# Patient Record
Sex: Male | Born: 1940 | Race: Black or African American | Hispanic: No | Marital: Married | Smoking: Never smoker
Health system: Southern US, Community
[De-identification: ages and names within clinical notes are randomized; demographics above are authoritative.]

## PROBLEM LIST (undated history)

## (undated) DIAGNOSIS — C61 Malignant neoplasm of prostate: Secondary | ICD-10-CM

## (undated) DIAGNOSIS — I10 Essential (primary) hypertension: Secondary | ICD-10-CM

## (undated) DIAGNOSIS — E785 Hyperlipidemia, unspecified: Secondary | ICD-10-CM

## (undated) HISTORY — PX: APPENDECTOMY: SHX54

## (undated) HISTORY — DX: Essential (primary) hypertension: I10

## (undated) HISTORY — DX: Hyperlipidemia, unspecified: E78.5

---

## 1997-10-24 ENCOUNTER — Encounter: Admission: RE | Admit: 1997-10-24 | Discharge: 1997-10-24 | Payer: Self-pay | Admitting: Hematology and Oncology

## 1997-11-12 ENCOUNTER — Ambulatory Visit (HOSPITAL_COMMUNITY): Admission: RE | Admit: 1997-11-12 | Discharge: 1997-11-12 | Payer: Self-pay | Admitting: *Deleted

## 1997-11-23 ENCOUNTER — Encounter: Admission: RE | Admit: 1997-11-23 | Discharge: 1997-11-23 | Payer: Self-pay | Admitting: Internal Medicine

## 1997-12-25 ENCOUNTER — Encounter: Admission: RE | Admit: 1997-12-25 | Discharge: 1997-12-25 | Payer: Self-pay | Admitting: *Deleted

## 2011-12-30 DIAGNOSIS — I10 Essential (primary) hypertension: Secondary | ICD-10-CM | POA: Insufficient documentation

## 2011-12-30 DIAGNOSIS — R32 Unspecified urinary incontinence: Secondary | ICD-10-CM | POA: Insufficient documentation

## 2011-12-30 DIAGNOSIS — Z7689 Persons encountering health services in other specified circumstances: Secondary | ICD-10-CM | POA: Insufficient documentation

## 2012-01-18 DIAGNOSIS — M7989 Other specified soft tissue disorders: Secondary | ICD-10-CM | POA: Insufficient documentation

## 2012-01-18 DIAGNOSIS — S4990XA Unspecified injury of shoulder and upper arm, unspecified arm, initial encounter: Secondary | ICD-10-CM | POA: Insufficient documentation

## 2012-04-12 ENCOUNTER — Emergency Department: Payer: Self-pay | Admitting: Emergency Medicine

## 2012-04-12 LAB — URINALYSIS, COMPLETE
Bacteria: NONE SEEN
Glucose,UR: NEGATIVE mg/dL (ref 0–75)
Ph: 7 (ref 4.5–8.0)
RBC,UR: 20 /HPF (ref 0–5)
Squamous Epithelial: NONE SEEN
WBC UR: 85 /HPF (ref 0–5)

## 2012-04-12 LAB — CBC
HCT: 34.5 % — ABNORMAL LOW (ref 40.0–52.0)
HGB: 12 g/dL — ABNORMAL LOW (ref 13.0–18.0)
MCH: 32.5 pg (ref 26.0–34.0)
MCHC: 34.6 g/dL (ref 32.0–36.0)
MCV: 94 fL (ref 80–100)
Platelet: 232 10*3/uL (ref 150–440)
RBC: 3.68 10*6/uL — ABNORMAL LOW (ref 4.40–5.90)
RDW: 14.1 % (ref 11.5–14.5)
WBC: 7.3 10*3/uL (ref 3.8–10.6)

## 2012-04-12 LAB — BASIC METABOLIC PANEL
BUN: 7 mg/dL (ref 7–18)
Calcium, Total: 8.6 mg/dL (ref 8.5–10.1)
Chloride: 98 mmol/L (ref 98–107)
Co2: 28 mmol/L (ref 21–32)
Creatinine: 1.1 mg/dL (ref 0.60–1.30)
EGFR (African American): >60
Osmolality: 260 (ref 275–301)
Potassium: 4 mmol/L (ref 3.5–5.1)
Sodium: 130 mmol/L — ABNORMAL LOW (ref 136–145)

## 2012-04-13 DIAGNOSIS — T148XXA Other injury of unspecified body region, initial encounter: Secondary | ICD-10-CM | POA: Insufficient documentation

## 2012-04-13 DIAGNOSIS — E785 Hyperlipidemia, unspecified: Secondary | ICD-10-CM | POA: Insufficient documentation

## 2012-04-13 DIAGNOSIS — I712 Thoracic aortic aneurysm, without rupture: Secondary | ICD-10-CM | POA: Insufficient documentation

## 2012-04-13 DIAGNOSIS — R339 Retention of urine, unspecified: Secondary | ICD-10-CM | POA: Insufficient documentation

## 2012-04-13 DIAGNOSIS — I714 Abdominal aortic aneurysm, without rupture: Secondary | ICD-10-CM | POA: Insufficient documentation

## 2012-04-14 LAB — URINE CULTURE

## 2012-08-04 DIAGNOSIS — Z1210 Encounter for screening for malignant neoplasm of intestinal tract, unspecified: Secondary | ICD-10-CM | POA: Insufficient documentation

## 2012-11-15 DIAGNOSIS — Z5320 Procedure and treatment not carried out because of patient's decision for unspecified reasons: Secondary | ICD-10-CM | POA: Insufficient documentation

## 2012-12-19 ENCOUNTER — Emergency Department: Payer: Self-pay | Admitting: Emergency Medicine

## 2012-12-19 LAB — BASIC METABOLIC PANEL
Anion Gap: 7 (ref 7–16)
BUN: 9 mg/dL (ref 7–18)
Calcium, Total: 8.9 mg/dL (ref 8.5–10.1)
Chloride: 101 mmol/L (ref 98–107)
Co2: 26 mmol/L (ref 21–32)
Creatinine: 0.98 mg/dL (ref 0.60–1.30)
EGFR (African American): >60
EGFR (Non-African Amer.): >60
Glucose: 95 mg/dL (ref 65–99)
Osmolality: 267 (ref 275–301)
Potassium: 3.7 mmol/L (ref 3.5–5.1)
Sodium: 134 mmol/L — ABNORMAL LOW (ref 136–145)

## 2012-12-19 LAB — CBC
HCT: 42.2 % (ref 40.0–52.0)
HGB: 14.2 g/dL (ref 13.0–18.0)
MCH: 31.7 pg (ref 26.0–34.0)
MCHC: 33.7 g/dL (ref 32.0–36.0)
MCV: 94 fL (ref 80–100)
Platelet: 251 10*3/uL (ref 150–440)
RBC: 4.48 10*6/uL (ref 4.40–5.90)
RDW: 14.4 % (ref 11.5–14.5)
WBC: 4.2 10*3/uL (ref 3.8–10.6)

## 2012-12-19 LAB — PRO B NATRIURETIC PEPTIDE: B-Type Natriuretic Peptide: 144 pg/mL — ABNORMAL HIGH (ref 0–125)

## 2012-12-19 LAB — PHOSPHORUS: Phosphorus: 3.7 mg/dL (ref 2.5–4.9)

## 2012-12-19 LAB — TROPONIN I: Troponin-I: <0.02 ng/mL

## 2012-12-19 LAB — MAGNESIUM: Magnesium: 2.2 mg/dL

## 2013-02-22 ENCOUNTER — Ambulatory Visit: Payer: Self-pay | Admitting: Specialist

## 2013-03-20 ENCOUNTER — Ambulatory Visit: Payer: Self-pay | Admitting: Specialist

## 2013-03-20 LAB — BASIC METABOLIC PANEL
Anion Gap: 5 — ABNORMAL LOW (ref 7–16)
BUN: 12 mg/dL (ref 7–18)
CHLORIDE: 100 mmol/L (ref 98–107)
CO2: 28 mmol/L (ref 21–32)
CREATININE: 1.06 mg/dL (ref 0.60–1.30)
Calcium, Total: 8.4 mg/dL — ABNORMAL LOW (ref 8.5–10.1)
EGFR (African American): >60
GLUCOSE: 86 mg/dL (ref 65–99)
Osmolality: 265 (ref 275–301)
Potassium: 4.1 mmol/L (ref 3.5–5.1)
SODIUM: 133 mmol/L — AB (ref 136–145)

## 2013-03-24 ENCOUNTER — Ambulatory Visit: Payer: Self-pay | Admitting: Specialist

## 2013-05-20 ENCOUNTER — Emergency Department: Payer: Self-pay | Admitting: Internal Medicine

## 2013-05-20 LAB — URINALYSIS, COMPLETE
BLOOD: NEGATIVE
Bilirubin,UR: NEGATIVE
GLUCOSE, UR: NEGATIVE mg/dL (ref 0–75)
Ketone: NEGATIVE
NITRITE: NEGATIVE
Ph: 7 (ref 4.5–8.0)
Protein: NEGATIVE
Specific Gravity: 1.006 (ref 1.003–1.030)
Squamous Epithelial: <1
WBC UR: 39 /HPF (ref 0–5)

## 2013-05-20 LAB — CBC
HCT: 40.3 % (ref 40.0–52.0)
HGB: 13.1 g/dL (ref 13.0–18.0)
MCH: 30.7 pg (ref 26.0–34.0)
MCHC: 32.5 g/dL (ref 32.0–36.0)
MCV: 94 fL (ref 80–100)
PLATELETS: 303 10*3/uL (ref 150–440)
RBC: 4.28 10*6/uL — AB (ref 4.40–5.90)
RDW: 14.8 % — AB (ref 11.5–14.5)
WBC: 4 10*3/uL (ref 3.8–10.6)

## 2013-05-20 LAB — BASIC METABOLIC PANEL
ANION GAP: 7 (ref 7–16)
BUN: 7 mg/dL (ref 7–18)
CALCIUM: 8.7 mg/dL (ref 8.5–10.1)
CHLORIDE: 102 mmol/L (ref 98–107)
CO2: 28 mmol/L (ref 21–32)
CREATININE: 1.01 mg/dL (ref 0.60–1.30)
EGFR (Non-African Amer.): >60
Glucose: 100 mg/dL — ABNORMAL HIGH (ref 65–99)
Osmolality: 272 (ref 275–301)
Potassium: 3.6 mmol/L (ref 3.5–5.1)
SODIUM: 137 mmol/L (ref 136–145)

## 2013-05-20 LAB — D-DIMER(ARMC): D-Dimer: 1272 ng/ml

## 2013-05-20 LAB — TROPONIN I: Troponin-I: <0.02 ng/mL

## 2014-04-05 ENCOUNTER — Ambulatory Visit: Payer: Medicare Other | Admitting: Podiatry

## 2014-04-19 ENCOUNTER — Ambulatory Visit: Payer: Medicare Other | Admitting: Podiatry

## 2014-05-10 ENCOUNTER — Ambulatory Visit: Payer: Medicare Other | Admitting: Podiatry

## 2014-05-17 ENCOUNTER — Ambulatory Visit: Payer: Medicare Other | Admitting: Podiatry

## 2014-05-21 ENCOUNTER — Ambulatory Visit: Payer: Medicare Other | Admitting: Podiatry

## 2014-05-26 NOTE — Op Note (Signed)
PATIENT NAME:  Daniel Estes, Daniel Estes MR#:  [id number] DATE OF BIRTH:  1940/05/19  DATE OF PROCEDURE:  03/24/2013  POSTOPERATIVE DIAGNOSIS:  1. Complete tear rotator cuff, right shoulder.  2. Degenerative arthritis acromioclavicular joint, right shoulder.   PROCEDURES: 1. Repair of rotator cuff tear, right shoulder with augmentation.  2. Excision of distal right clavicle.   SURGEON: Christophe Louis, M.D.   ANESTHESIA: Supraclavicular block and general   ESTIMATED BLOOD LOSS: 100 mL.   COMPLICATIONS: None.  PROCEDURE PERFORMED: Two grams of Ancef were given intravenously prior to the procedure. Supraclavicular block had previously been performed by anesthesia prior to the patient coming to the Operating Room. General anesthesia is induced. The patient is placed in the lawn chair position and secured with a bump beneath the right shoulder. The right shoulder is thoroughly prepped with alcohol and ChloraPrep and draped in standard sterile fashion. The line of the proposed skin incision is infiltrated with 0.5% Marcaine with epinephrine. Standard anterosuperior incision is made. The skin edges are undermined and the dissection carried over to the acromioclavicular joint. Using the saw and the rongeur, the distal 1/2 inch of the clavicle is excised. Careful palpation demonstrates a smooth remaining distal clavicle. This wound is thoroughly irrigated multiple times. The deltoid is then split over the anterior acromion and standard anterior acromioplasty is performed. The bursa is excised. Examination of the rotator cuff demonstrates a large tear of the infraspinatus tendon with complete retraction. This was seen to be a chronic tear as the infraspinatus tendon itself could not be mobilized. The remaining portion of the rotator cuff was then mobilized as much as possible and repair was performed using a supplementary patch, product name is Conexa. The Conexa patch is fashioned to the defect in the rotator cuff  in the area of the infraspinatus. This is sutured down using 4 - 0 Mersilene and intermittent #2 Ethibond sutures. At the close of the repair the rotator cuff was covering the humeral head and patch was taut and stable on range of motion. The wound is thoroughly irrigated multiple times. The deltoid is repaired with 2-0 Vicryl. The subcutaneous tissue is closed with several 2-0 Vicryl sutures and the skin is closed with the staple. Subacromial bursa is injected with 15 mL of Marcaine with epinephrine with 4 mg of morphine. A soft bulky dressing is applied along with a sling. The patient is returned to the recovery room in satisfactory condition having tolerated the procedure quite well.    ____________________________ Lucas Mallow, MD ces:sg D: 03/27/2013 09:15:08 ET T: 03/27/2013 11:21:23 ET JOB#: [id number]  cc: Lucas Mallow, MD, <Dictator> Lucas Mallow MD ELECTRONICALLY SIGNED 03/27/2013 16:40

## 2014-06-04 ENCOUNTER — Ambulatory Visit: Payer: Medicare Other | Admitting: Podiatry

## 2014-06-20 ENCOUNTER — Ambulatory Visit (INDEPENDENT_AMBULATORY_CARE_PROVIDER_SITE_OTHER): Payer: Medicare Other | Admitting: Podiatry

## 2014-06-20 ENCOUNTER — Ambulatory Visit (INDEPENDENT_AMBULATORY_CARE_PROVIDER_SITE_OTHER): Payer: Medicare Other

## 2014-06-20 ENCOUNTER — Encounter: Payer: Self-pay | Admitting: Podiatry

## 2014-06-20 VITALS — BP 148/76 | HR 74 | Resp 16 | Ht 74.0 in | Wt 265.0 lb

## 2014-06-20 DIAGNOSIS — B351 Tinea unguium: Secondary | ICD-10-CM

## 2014-06-20 DIAGNOSIS — M2040 Other hammer toe(s) (acquired), unspecified foot: Secondary | ICD-10-CM

## 2014-06-20 DIAGNOSIS — G629 Polyneuropathy, unspecified: Secondary | ICD-10-CM | POA: Diagnosis not present

## 2014-06-20 DIAGNOSIS — Q828 Other specified congenital malformations of skin: Secondary | ICD-10-CM

## 2014-06-20 MED ORDER — GABAPENTIN 100 MG PO CAPS: 100.0000 mg | ORAL_CAPSULE | Freq: Every day | ORAL | Status: DC

## 2014-06-20 NOTE — Progress Notes (Signed)
   Subjective:    Patient ID: Daniel Estes, male    DOB: 1940/12/23, 74 y.o.   MRN: [id number]  HPI Comments: "I have terrible feet"  Patient c/o numbness bilateral feet for several months, worsened recently. He has calluses plantar forefoot and get very tender when thicker. The numbness happens more at night. The toenails are thick and dark. He has been trimming the calluses down, but the left one is very sore today.     Review of Systems  Genitourinary: Positive for urgency and frequency.  All other systems reviewed and are negative.      Objective:   Physical Exam: I have reviewed his past medical history medications allergy surgery social history and review of systems. Pulses are strongly palpable bilateral. Neurologic sensorium is intact for since was the monofilament. Slight decrease in vibratory sensation to the forefoot. Deep tendon reflexes are intact muscle strength is normal bilateral. Orthopedic evaluation and x-rays rectus foot type with severe hammertoe deformities 2 through 5 bilateral. He has porokeratotic lesions plantar aspect of the bilateral foot with tinea pedis and onychomycosis or at the very least nail dystrophy. No open wounds or lesions.         Assessment & Plan:  Assessment: Hammertoes bilateral. Nail dystrophies bilateral. Porokeratosis bilateral. Idiopathic neuropathy bilateral.  Plan: Discussed etiology pathology conservative versus surgical therapies. Started him on 100 mg of gabapentin at nighttime. Debrided nails for sample. Also placed salicylic acid under occlusion for the porokeratotic lesion. Discussed appropriate shoe gear stretching exercises and ice therapy we discussed the etiology pathology conservative versus surgical therapies. I'll follow-up with him an approximate 1 month for med check and hopefully his pathology will have return by that time.

## 2014-07-18 ENCOUNTER — Ambulatory Visit: Payer: Medicare Other | Admitting: Podiatry

## 2014-08-08 ENCOUNTER — Ambulatory Visit: Payer: Medicare Other | Admitting: Podiatry

## 2014-08-27 ENCOUNTER — Ambulatory Visit: Payer: Medicare Other | Admitting: Podiatry

## 2014-09-10 ENCOUNTER — Ambulatory Visit (INDEPENDENT_AMBULATORY_CARE_PROVIDER_SITE_OTHER): Payer: Medicare Other | Admitting: Podiatry

## 2014-09-10 DIAGNOSIS — Q828 Other specified congenital malformations of skin: Secondary | ICD-10-CM | POA: Diagnosis not present

## 2014-09-10 DIAGNOSIS — M79673 Pain in unspecified foot: Secondary | ICD-10-CM

## 2014-09-10 DIAGNOSIS — B351 Tinea unguium: Secondary | ICD-10-CM

## 2014-09-10 NOTE — Progress Notes (Signed)
He presents today for follow-up for his neuropathy as well as his pathology results regarding his toenails and also has painful calluses. He states that he would like to have the calluses trimmed. He also states that he was unable to take his gabapentin due to constipation.  Objective: Pulses are palpable bilateral. Reactive hyperkeratoses are noted bilateral and debrided these down as close to the skin is elected today. His nails are thick yellow dystrophic mycotic painful palpation. Pathology reports did demonstrate positive onychomycosis.  Assessment: Onychomycosis pain in limb bilateral. Neuropathy. Porokeratosis bilateral foot.  Plan: Debrided nails 1 through 5 bilateral. Offer to treat the onychomycosis he declined. Debrided all reactive hyperkeratoses and placed salicylic acid under occlusion to be left on for 3 days and then washed out thoroughly. I will follow-up with him in a few months.

## 2014-09-12 DIAGNOSIS — N210 Calculus in bladder: Secondary | ICD-10-CM | POA: Insufficient documentation

## 2014-09-26 ENCOUNTER — Other Ambulatory Visit: Payer: Self-pay | Admitting: Student

## 2014-09-26 DIAGNOSIS — R1310 Dysphagia, unspecified: Secondary | ICD-10-CM

## 2014-10-01 ENCOUNTER — Ambulatory Visit
Admission: RE | Admit: 2014-10-01 | Discharge: 2014-10-01 | Disposition: A | Discharge status: Home / Self Care | Payer: Medicare Other | Source: Ambulatory Visit | Attending: Student | Admitting: Student

## 2014-10-01 DIAGNOSIS — K449 Diaphragmatic hernia without obstruction or gangrene: Secondary | ICD-10-CM | POA: Insufficient documentation

## 2014-10-01 DIAGNOSIS — R1310 Dysphagia, unspecified: Secondary | ICD-10-CM | POA: Diagnosis present

## 2014-10-10 DIAGNOSIS — C61 Malignant neoplasm of prostate: Secondary | ICD-10-CM | POA: Insufficient documentation

## 2014-11-06 ENCOUNTER — Emergency Department
Admission: EM | Admit: 2014-11-06 | Discharge: 2014-11-06 | Disposition: A | Discharge status: Home / Self Care | Payer: Medicare Other | Attending: Emergency Medicine | Admitting: Emergency Medicine

## 2014-11-06 DIAGNOSIS — R339 Retention of urine, unspecified: Secondary | ICD-10-CM | POA: Diagnosis present

## 2014-11-06 DIAGNOSIS — Y846 Urinary catheterization as the cause of abnormal reaction of the patient, or of later complication, without mention of misadventure at the time of the procedure: Secondary | ICD-10-CM | POA: Diagnosis not present

## 2014-11-06 DIAGNOSIS — Z79899 Other long term (current) drug therapy: Secondary | ICD-10-CM | POA: Insufficient documentation

## 2014-11-06 DIAGNOSIS — N390 Urinary tract infection, site not specified: Secondary | ICD-10-CM | POA: Diagnosis not present

## 2014-11-06 DIAGNOSIS — T83191A Other mechanical complication of urinary sphincter implant, initial encounter: Secondary | ICD-10-CM | POA: Diagnosis not present

## 2014-11-06 LAB — CBC
HCT: 40.2 % (ref 40.0–52.0)
Hemoglobin: 13.9 g/dL (ref 13.0–18.0)
MCH: 32.2 pg (ref 26.0–34.0)
MCHC: 34.5 g/dL (ref 32.0–36.0)
MCV: 93.5 fL (ref 80.0–100.0)
PLATELETS: 184 10*3/uL (ref 150–440)
RBC: 4.3 MIL/uL — AB (ref 4.40–5.90)
RDW: 13.8 % (ref 11.5–14.5)
WBC: 4 10*3/uL (ref 3.8–10.6)

## 2014-11-06 LAB — URINALYSIS COMPLETE WITH MICROSCOPIC (ARMC ONLY)
Bacteria, UA: NONE SEEN
SPECIFIC GRAVITY, URINE: 1.015 (ref 1.005–1.030)
SQUAMOUS EPITHELIAL / LPF: NONE SEEN

## 2014-11-06 LAB — BASIC METABOLIC PANEL
Anion gap: 12 (ref 5–15)
BUN: 9 mg/dL (ref 6–20)
CALCIUM: 9.3 mg/dL (ref 8.9–10.3)
CHLORIDE: 93 mmol/L — AB (ref 101–111)
CO2: 27 mmol/L (ref 22–32)
CREATININE: 0.97 mg/dL (ref 0.61–1.24)
GFR calc non Af Amer: >60 mL/min (ref >60)
Glucose, Bld: 104 mg/dL — ABNORMAL HIGH (ref 65–99)
Potassium: 3.3 mmol/L — ABNORMAL LOW (ref 3.5–5.1)
Sodium: 132 mmol/L — ABNORMAL LOW (ref 135–145)

## 2014-11-06 MED ORDER — CIPROFLOXACIN HCL 500 MG PO TABS: 500.0000 mg | ORAL_TABLET | Freq: Once | ORAL | Status: DC

## 2014-11-06 MED ORDER — CIPROFLOXACIN HCL 500 MG PO TABS
500.0000 mg | ORAL_TABLET | Freq: Once | ORAL | Status: AC
  Administered 2014-11-06: 500 mg via ORAL
  Filled 2014-11-06: qty 1

## 2014-11-06 NOTE — ED Notes (Signed)
Pt c/o passing blood in urine today and for the past 3-4 hrs has not been able to pass urine even with a mechanical sphincter

## 2014-11-06 NOTE — ED Provider Notes (Signed)
[facility] Emergency Department Provider Note  ____________________________________________  Time seen: 2050  I have reviewed the triage vital signs and the nursing notes.   HISTORY  Chief Complaint Urinary Retention     HPI Daniel Estes is a 74 y.o. male who reports he has had problems with urinary retention today. 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 Today when he attempted to release it he was unable to get adequate flow. He reports having some debris and blood passing through and that this is not completely unusual for him.  While waiting for treatment and evaluation in the emergency department, he reports he was able to void quite well. He's had 2 more episodes of voiding without retention. He feels comfortable.  His urologist is at [facility].     Past medical history Hypertension  There are no active problems to display for this patient.  Surgical: Urinary sphincter implant   Current Outpatient Rx  Name  Route  Sig  Dispense  Refill  . amLODipine (NORVASC) 10 MG tablet   Oral   Take 10 mg by mouth daily.         Marland Kitchen atenolol (TENORMIN) 100 MG tablet   Oral   Take 100 mg by mouth daily.         . ciprofloxacin (CIPRO) 500 MG tablet   Oral   Take 1 tablet (500 mg total) by mouth once.   14 tablet   0   . gabapentin (NEURONTIN) 100 MG capsule   Oral   Take 1 capsule (100 mg total) by mouth at bedtime. Patient not taking: Reported on 09/10/2014   30 capsule   3   . losartan (COZAAR) 100 MG tablet   Oral   Take 100 mg by mouth daily.           Allergies Review of patient's allergies indicates no known allergies.  No family history on file.  Social History Social History  Substance Use Topics  . Smoking status: Never Smoker   . Smokeless tobacco: Not on file  . Alcohol Use: 0.0 oz/week    0 Standard drinks or equivalent per week    Review of Systems  Constitutional: Negative for fever. ENT:  Negative for sore throat. Cardiovascular: Negative for chest pain. Respiratory: Negative for cough. Gastrointestinal: Negative for abdominal pain, vomiting and diarrhea. Genitourinary:  Urinary retention with implanted sphincter. See history of present illness Musculoskeletal: No myalgias or injuries. Skin: Negative for rash. Neurological: Negative for paresthesia or weakness   10-point ROS otherwise negative.  ____________________________________________   PHYSICAL EXAM:  VITAL SIGNS: ED Triage Vitals  Enc Vitals Group     BP 11/06/14 1842 149/100 mmHg     Pulse Rate 11/06/14 1842 83     Resp 11/06/14 1842 18     Temp 11/06/14 1842 98.3 F (36.8 C)     Temp Source 11/06/14 1842 Oral     SpO2 11/06/14 1842 97 %     Weight 11/06/14 1842 250 lb (113.399 kg)     Height 11/06/14 1842 6\' 2"  (1.88 m)     Head Cir --      Peak Flow --      Pain Score 11/06/14 1859 7     Pain Loc --      Pain Edu? --      Excl. in [place]? --     Constitutional:  Alert and oriented. Well appearing and in no distress. ENT   Head:  Normocephalic and atraumatic.   Nose: No congestion/rhinnorhea.    Cardiovascular: Normal rate, regular rhythm, no murmur noted Respiratory:  Normal respiratory effort, no tachypnea.    Breath sounds are clear and equal bilaterally.  Gastrointestinal: Soft and nontender. No distention.  Back: No muscle spasm, no tenderness, no CVA tenderness. Musculoskeletal: No deformity noted. Nontender with normal range of motion in all extremities.  No noted edema. Neurologic:  Normal speech and language. No gross focal neurologic deficits are appreciated.  Skin:  Skin is warm, dry. No rash noted. Psychiatric: Mood and affect are normal. Speech and behavior are normal.  ____________________________________________    LABS (pertinent positives/negatives)  Labs Reviewed  BASIC METABOLIC PANEL - Abnormal; Notable for the following:    Sodium 132 (*)    Potassium 3.3 (*)     Chloride 93 (*)    Glucose, Bld 104 (*)    All other components within normal limits  CBC - Abnormal; Notable for the following:    RBC 4.30 (*)    All other components within normal limits  URINALYSIS COMPLETEWITH MICROSCOPIC ([facility] ONLY) - Abnormal; Notable for the following:    Color, Urine RED (*)    APPearance CLOUDY (*)    Glucose, UA   (*)    Value: TEST NOT REPORTED DUE TO COLOR INTERFERENCE OF URINE PIGMENT   Bilirubin Urine   (*)    Value: TEST NOT REPORTED DUE TO COLOR INTERFERENCE OF URINE PIGMENT   Ketones, ur   (*)    Value: TEST NOT REPORTED DUE TO COLOR INTERFERENCE OF URINE PIGMENT   Hgb urine dipstick   (*)    Value: TEST NOT REPORTED DUE TO COLOR INTERFERENCE OF URINE PIGMENT   Protein, ur   (*)    Value: TEST NOT REPORTED DUE TO COLOR INTERFERENCE OF URINE PIGMENT   Nitrite   (*)    Value: TEST NOT REPORTED DUE TO COLOR INTERFERENCE OF URINE PIGMENT   Leukocytes, UA   (*)    Value: TEST NOT REPORTED DUE TO COLOR INTERFERENCE OF URINE PIGMENT   All other components within normal limits  URINE CULTURE     ____________________________________________   INITIAL IMPRESSION / ASSESSMENT AND PLAN / ED COURSE  Pertinent labs & imaging results that were available during my care of the patient were reviewed by me and considered in my medical decision making (see chart for details).  The patient's issue of urinary retention appears to up resolved. The sphincters working properly this time. His urine does show red blood cells and white blood cells too numerous to count. We will place him on Cipro. I discussed follow-up with his urologist with the patient. He looks well and is comfortable with the current plan and disposition.  ____________________________________________   FINAL CLINICAL IMPRESSION(S) / ED DIAGNOSES  Final diagnoses:  UTI (lower urinary tract infection)  Urinary retention  Other mechanical complication of urinary sphincter implant, initial  encounter ([place])      Ahmed Prima, MD 11/06/14 2129

## 2014-11-06 NOTE — ED Notes (Signed)
Pt had episode of urinary retention that has resolved prior to er treatment room.  Pt has a urinary sphincter and can not have a catheter.  Pt states he passes blood and mucous.  Hx of bladder stones  Treated at [facility].  Pt alert.  Skin warm and dry.  Family with pt.  md at bedside.

## 2014-11-06 NOTE — Discharge Instructions (Signed)
Take ciprofloxacin twice a day as prescribed. Follow-up with your urologist at [facility]. If you have further urinary retention, pain, fever, or other urgent concerns, return to the emergency department.  Urinary Tract Infection A urinary tract infection (UTI) can occur any place along the urinary tract. The tract includes the kidneys, ureters, bladder, and urethra. A type of germ called bacteria often causes a UTI. UTIs are often helped with antibiotic medicine.  HOME CARE   If given, take antibiotics as told by your doctor. Finish them even if you start to feel better.  Drink enough fluids to keep your pee (urine) clear or pale yellow.  Avoid tea, drinks with caffeine, and bubbly (carbonated) drinks.  Pee often. Avoid holding your pee in for a long time.  Pee before and after having sex (intercourse).  Wipe from front to back after you poop (bowel movement) if you are a woman. Use each tissue only once. GET HELP RIGHT AWAY IF:   You have back pain.  You have lower belly (abdominal) pain.  You have chills.  You feel sick to your stomach (nauseous).  You throw up (vomit).  Your burning or discomfort with peeing does not go away.  You have a fever.  Your symptoms are not better in 3 days. MAKE SURE YOU:   Understand these instructions.  Will watch your condition.  Will get help right away if you are not doing well or get worse.   This information is not intended to replace advice given to you by your health care provider. Make sure you discuss any questions you have with your health care provider.   Document Released: 07/08/2007 Document Revised: 02/09/2014 Document Reviewed: 08/20/2011 Elsevier Interactive Patient Education [employer].

## 2014-11-10 LAB — URINE CULTURE: Culture: 30000

## 2014-12-05 ENCOUNTER — Ambulatory Visit: Payer: Self-pay | Admitting: Family Medicine

## 2014-12-10 ENCOUNTER — Ambulatory Visit: Payer: Medicare Other | Admitting: Podiatry

## 2014-12-17 ENCOUNTER — Encounter: Payer: Self-pay | Admitting: Podiatry

## 2014-12-17 ENCOUNTER — Ambulatory Visit (INDEPENDENT_AMBULATORY_CARE_PROVIDER_SITE_OTHER): Payer: Medicare Other | Admitting: Podiatry

## 2014-12-17 DIAGNOSIS — M79676 Pain in unspecified toe(s): Secondary | ICD-10-CM

## 2014-12-17 DIAGNOSIS — B351 Tinea unguium: Secondary | ICD-10-CM

## 2014-12-17 DIAGNOSIS — Q828 Other specified congenital malformations of skin: Secondary | ICD-10-CM | POA: Diagnosis not present

## 2014-12-17 NOTE — Progress Notes (Signed)
He presents today for chief complaint of painful elongated toenails and corns and calluses bilateral. He states that his been doing pretty well with no complications.  Objective: Vital signs are stable he is alert and oriented 3 pulses are strongly palpable. His nails are thick yellow dystrophic onychomycotic elongated with reactive hyperkeratosis plantar aspect of the bilateral foot. No open lesions or wounds.  Assessment: Pain in limb secondary to diabetes corns and calluses as well as elongated nails 1 through 5 bilateral. With onychomycosis.  Plan: Debridement of nails 1 through 5 bilateral covered service secondary to pain.

## 2014-12-31 DIAGNOSIS — E669 Obesity, unspecified: Secondary | ICD-10-CM | POA: Insufficient documentation

## 2015-02-18 ENCOUNTER — Ambulatory Visit: Payer: Medicare Other | Admitting: Podiatry

## 2015-03-20 ENCOUNTER — Ambulatory Visit (INDEPENDENT_AMBULATORY_CARE_PROVIDER_SITE_OTHER): Payer: Medicare Other | Admitting: Podiatry

## 2015-03-20 ENCOUNTER — Encounter: Payer: Self-pay | Admitting: Podiatry

## 2015-03-20 DIAGNOSIS — M79676 Pain in unspecified toe(s): Secondary | ICD-10-CM

## 2015-03-20 DIAGNOSIS — Q828 Other specified congenital malformations of skin: Secondary | ICD-10-CM

## 2015-03-20 DIAGNOSIS — B351 Tinea unguium: Secondary | ICD-10-CM | POA: Diagnosis not present

## 2015-03-20 MED ORDER — GABAPENTIN 100 MG PO CAPS: ORAL_CAPSULE | ORAL | Status: DC

## 2015-03-20 NOTE — Progress Notes (Signed)
He presents today chief complaint of painful elongated toenails and calluses of plantar aspect of the bilateral foot.  Objective: Vital signs are stable alert and oriented 3. Pulses are intact. His toenails are thick yellow dystrophic onychomycotic and painful on palpation as well as debridement. Reactive hyperkeratotic splint or aspect of the forefoot bilateral.  Assessment: Diabetes. Pain in limb secondary to onychomycosis and porokeratosis.  Plan: Debridement bilateral of toenails and calluses.

## 2015-05-20 ENCOUNTER — Ambulatory Visit: Payer: Medicare Other | Admitting: Podiatry

## 2015-08-05 ENCOUNTER — Encounter: Payer: Self-pay | Admitting: Podiatry

## 2015-09-18 ENCOUNTER — Ambulatory Visit: Payer: Medicare Other | Admitting: Podiatry

## 2015-10-16 ENCOUNTER — Ambulatory Visit: Payer: Medicare Other | Admitting: Podiatry

## 2015-10-17 ENCOUNTER — Emergency Department: Payer: Medicare Other

## 2015-10-17 ENCOUNTER — Encounter: Payer: Self-pay | Admitting: *Deleted

## 2015-10-17 DIAGNOSIS — Z8546 Personal history of malignant neoplasm of prostate: Secondary | ICD-10-CM | POA: Insufficient documentation

## 2015-10-17 DIAGNOSIS — R0600 Dyspnea, unspecified: Secondary | ICD-10-CM | POA: Diagnosis not present

## 2015-10-17 DIAGNOSIS — R0681 Apnea, not elsewhere classified: Secondary | ICD-10-CM | POA: Diagnosis present

## 2015-10-17 DIAGNOSIS — Z79899 Other long term (current) drug therapy: Secondary | ICD-10-CM | POA: Diagnosis not present

## 2015-10-17 LAB — BASIC METABOLIC PANEL
ANION GAP: 5 (ref 5–15)
BUN: 14 mg/dL (ref 6–20)
CO2: 34 mmol/L — AB (ref 22–32)
Calcium: 9.2 mg/dL (ref 8.9–10.3)
Chloride: 97 mmol/L — ABNORMAL LOW (ref 101–111)
Creatinine, Ser: 0.96 mg/dL (ref 0.61–1.24)
GFR calc Af Amer: >60 mL/min (ref >60)
GFR calc non Af Amer: >60 mL/min (ref >60)
GLUCOSE: 121 mg/dL — AB (ref 65–99)
POTASSIUM: 4 mmol/L (ref 3.5–5.1)
Sodium: 136 mmol/L (ref 135–145)

## 2015-10-17 LAB — CBC
HEMATOCRIT: 38.3 % — AB (ref 40.0–52.0)
HEMOGLOBIN: 13.3 g/dL (ref 13.0–18.0)
MCH: 33.1 pg (ref 26.0–34.0)
MCHC: 34.6 g/dL (ref 32.0–36.0)
MCV: 95.7 fL (ref 80.0–100.0)
PLATELETS: 258 10*3/uL (ref 150–440)
RBC: 4 MIL/uL — AB (ref 4.40–5.90)
RDW: 14.4 % (ref 11.5–14.5)
WBC: 6.2 10*3/uL (ref 3.8–10.6)

## 2015-10-17 LAB — TROPONIN I

## 2015-10-17 NOTE — ED Triage Notes (Signed)
Pt reports he was sleeping in a recliner tonight and was awakened with sob.  No chest pain.  No cough.  Nonsmoker.  No n/v/d.  Pt also reports sharp pain in right temporal area.  No headache.  Pt alert. Speech clear.  No diff ambulating.

## 2015-10-18 ENCOUNTER — Ambulatory Visit: Payer: Medicare Other | Admitting: Podiatry

## 2015-10-18 ENCOUNTER — Emergency Department
Admission: EM | Admit: 2015-10-18 | Discharge: 2015-10-18 | Disposition: A | Discharge status: Home / Self Care | Payer: Medicare Other | Attending: Emergency Medicine | Admitting: Emergency Medicine

## 2015-10-18 DIAGNOSIS — R0600 Dyspnea, unspecified: Secondary | ICD-10-CM

## 2015-10-18 LAB — TROPONIN I

## 2015-10-18 NOTE — ED Provider Notes (Signed)
[facility] Emergency Department Provider Note   First MD Initiated Contact with Patient 10/18/15 [phone]     (approximate)  I have reviewed the triage vital signs and the nursing notes.   HISTORY  Chief Complaint Shortness of Breath   HPI Daniel Estes is a 75 y.o. 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 Patient denies any chest pain or abdominal pain. No diaphoresis dizziness during the event. Patient denies any symptoms at present. Patient states that he's had episodes like this in the past and is concerned that he may have sleep apnea. Patient's wife at bedside states that he has episodes like this and does have snoring with periods of cessation of breathing   No past medical history on file.  Patient Active Problem List   Diagnosis Date Noted  . Class 1 obesity 12/31/2014  . 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 Primary malignant neoplasm of prostate ([place]) 10/10/2014  . Bladder calculi 09/12/2014  . Patient refusal of treatment 11/15/2012  . Encounter for screening for malignant neoplasm of intestinal tract 08/04/2012  . Abdominal aortic aneurysm (AAA) without rupture ([place]) 04/13/2012  . Aneurysm of thoracic aorta ([place]) 04/13/2012  . HLD (hyperlipidemia) 04/13/2012  . Hematoma 04/13/2012  . Bladder retention 04/13/2012  . Injury of shoulder 01/18/2012  . Limb swelling 01/18/2012  . Absence of bladder continence 12/30/2011  . Persons encountering health services in other specified circumstances 12/30/2011  . BP (high blood pressure) 12/30/2011    No past surgical history on file.  Prior to Admission medications   Medication Sig Start Date End Date Taking? Authorizing Provider  abiraterone Acetate (ZYTIGA) 250 MG tablet  01/16/15   Historical Provider, MD  Acetaminophen 500 MG coapsule Take by mouth.     Historical Provider, MD  amLODipine (NORVASC) 10 MG tablet Take 10 mg by mouth daily.    Historical Provider, MD  atenolol (TENORMIN) 100 MG tablet Take 100 mg by mouth daily.    Historical Provider, MD  enoxaparin (LOVENOX) 40 MG/0.4ML injection  01/01/15   Historical Provider, MD  gabapentin (NEURONTIN) 100 MG capsule Take 1 capsule (100 mg total) by mouth at bedtime. Patient not taking: Reported on 09/10/2014 06/20/14   Max T Hyatt, DPM  gabapentin (NEURONTIN) 100 MG capsule Take two capsules by mouth at bedtime. 03/20/15   Max T Hyatt, DPM  Ibuprofen (RA IBUPROFEN) 200 MG CAPS Take by mouth.    Historical Provider, MD  Leuprolide Acetate, 6 Month, (LUPRON) 45 MG injection Inject into the muscle.    Historical Provider, MD  losartan (COZAAR) 100 MG tablet Take 100 mg by mouth daily.    Historical Provider, MD  mirabegron ER (MYRBETRIQ) 25 MG TB24 tablet Take by mouth.    Historical Provider, MD  Multiple Vitamin (MULTIVITAMIN) capsule Take by mouth.    Historical Provider, MD  oxyCODONE (OXY IR/ROXICODONE) 5 MG immediate release tablet TK 1 TO 2 TS PO Q 3 H PRN P 01/01/15   Historical Provider, MD  predniSONE (DELTASONE) 5 MG tablet Take by mouth. 01/16/15   Historical Provider, MD  Trospium Chloride 60 MG CP24 TK 1 C PO QAM BEFORE BREAKFAST 12/28/14   Historical Provider, MD    Allergies Review of patient's allergies indicates no known allergies.  No family history on file.  Social History Social History  Substance Use Topics  . Smoking status: Never  Smoker  . Smokeless tobacco: Never Used  . Alcohol use 0.0 oz/week    Review of Systems Constitutional: No fever/chills Eyes: No visual changes. ENT: No sore throat. Cardiovascular: Denies chest pain. Respiratory: Denies shortness of breath. Gastrointestinal: No abdominal pain.  No nausea, no vomiting.  No diarrhea.  No constipation. Genitourinary: Negative for dysuria. Musculoskeletal: Negative for back pain. Skin: Negative for  rash. Neurological: Negative for headaches, focal weakness or numbness.  10-point ROS otherwise negative.  ____________________________________________   PHYSICAL EXAM:  VITAL SIGNS: ED Triage Vitals  Enc Vitals Group     BP 10/17/15 2100 140/80     Pulse Rate 10/18/15 0102 (!) 59     Resp 10/17/15 2100 16     Temp 10/17/15 2100 98.3 F (36.8 C)     Temp Source 10/17/15 2100 Oral     SpO2 10/17/15 2100 97 %     Weight 10/17/15 2101 270 lb (122.5 kg)     Height 10/17/15 2101 6\' 2"  (1.88 m)     Head Circumference --      Peak Flow --      Pain Score 10/17/15 2121 0     Pain Loc --      Pain Edu? --      Excl. in [place]? --     Constitutional: Alert and oriented. Well appearing and in no acute distress. Eyes: Conjunctivae are normal. PERRL. EOMI. Head: Atraumatic. Mouth/Throat: Mucous membranes are moist.  Oropharynx non-erythematous. Neck: No stridor.  No meningeal signs. Cardiovascular: Normal rate, regular rhythm. Good peripheral circulation. Grossly normal heart sounds. Respiratory: Normal respiratory effort.  No retractions. Lungs CTAB. Gastrointestinal: Soft and nontender. No distention.  Musculoskeletal: No lower extremity tenderness nor edema. No gross deformities of extremities. Neurologic:  Normal speech and language. No gross focal neurologic deficits are appreciated.  Skin:  Skin is warm, dry and intact. No rash noted. Psychiatric: Mood and affect are normal. Speech and behavior are normal.  ____________________________________________   LABS (all labs ordered are listed, but only abnormal results are displayed)  Labs Reviewed  BASIC METABOLIC PANEL - Abnormal; Notable for the following:       Result Value   Chloride 97 (*)    CO2 34 (*)    Glucose, Bld 121 (*)    All other components within normal limits  CBC - Abnormal; Notable for the following:    RBC 4.00 (*)    HCT 38.3 (*)    All other components within normal limits  TROPONIN I  TROPONIN I    ____________________________________________  EKG  ED ECG REPORT I, [place] N Davonta Stroot, the attending physician, personally viewed and interpreted this ECG.   Date: 10/18/2015  EKG Time: 9:05 PM  Rate: 58  Rhythm: Sinus bradycardia  Axis: Normal  Intervals: Normal  ST&T Change: None  ____________________________________________  RADIOLOGY I, Ririe N Trygve Thal, personally viewed and evaluated these images (plain radiographs) as part of my medical decision making, as well as reviewing the written report by the radiologist.  Dg Chest 2 View  Result Date: 10/17/2015 CLINICAL DATA:  Shortness of breath. EXAM: CHEST  2 VIEW COMPARISON:  Chest radiograph and CTA 05/20/2013 FINDINGS: The cardiac silhouette is within normal limits in size. Tortuosity and atherosclerosis are again seen of the thoracic aorta. The lungs are slightly hypoinflated without evidence of airspace consolidation, edema, pleural effusion, or pneumothorax. A 6 mm rounded nodule is questioned in the lateral left mid lung overlying the left seventh rib and inferior  scapula, not present on the prior studies. No acute osseous abnormality is seen. IMPRESSION: 1. No evidence of acute cardiopulmonary process. 2. Aortic atherosclerosis. 3. Possible 6 mm left midlung nodule. This could be further assessed with outpatient chest CT as clinically desired. Electronically Signed   By: Logan Bores M.D.   On: 10/17/2015 22:22    ________________________  Procedures     INITIAL IMPRESSION / ASSESSMENT AND PLAN / ED COURSE  Pertinent labs & imaging results that were available during my care of the patient were reviewed by me and considered in my medical decision making (see chart for details).  Patient were brief episode of dyspnea lasting seconds without any associated symptoms, asymptomatic at present. Laboratory data revealed no gross abnormalities including troponin 2 EKG also unremarkable. Given absence of pain no CT scan chest  performed   Clinical Course    ____________________________________________  FINAL CLINICAL IMPRESSION(S) / ED DIAGNOSES  Final diagnoses:  Dyspnea     MEDICATIONS GIVEN DURING THIS VISIT:  Medications - No data to display   NEW OUTPATIENT MEDICATIONS STARTED DURING THIS VISIT:  Discharge Medication List as of 10/18/2015  2:35 AM      Discharge Medication List as of 10/18/2015  2:35 AM      Discharge Medication List as of 10/18/2015  2:35 AM       Note:  This document was prepared using Dragon voice recognition software and may include unintentional dictation errors.    Gregor Hams, MD 10/18/15 [phone]

## 2015-10-18 NOTE — ED Notes (Signed)
MD at bedside. 

## 2015-10-18 NOTE — ED Notes (Signed)
Discharge instructions reviewed with patient. Questions fielded by this RN. Patient verbalizes understanding of instructions. Patient discharged home in stable condition per Brown MD . No acute distress noted at time of discharge.   

## 2015-11-08 ENCOUNTER — Ambulatory Visit: Payer: Medicare Other | Admitting: Podiatry

## 2015-11-19 ENCOUNTER — Ambulatory Visit: Payer: Medicare Other | Admitting: Podiatry

## 2015-11-21 ENCOUNTER — Ambulatory Visit (INDEPENDENT_AMBULATORY_CARE_PROVIDER_SITE_OTHER): Payer: Medicare Other | Admitting: Vascular Surgery

## 2015-12-03 ENCOUNTER — Ambulatory Visit: Payer: Medicare Other | Admitting: Podiatry

## 2015-12-12 ENCOUNTER — Ambulatory Visit (INDEPENDENT_AMBULATORY_CARE_PROVIDER_SITE_OTHER): Payer: Medicare Other | Admitting: Vascular Surgery

## 2016-01-03 ENCOUNTER — Ambulatory Visit (INDEPENDENT_AMBULATORY_CARE_PROVIDER_SITE_OTHER): Payer: Medicare Other | Admitting: Podiatry

## 2016-01-03 ENCOUNTER — Encounter: Payer: Self-pay | Admitting: Podiatry

## 2016-01-03 VITALS — BP 152/93 | HR 63

## 2016-01-03 DIAGNOSIS — M79609 Pain in unspecified limb: Secondary | ICD-10-CM

## 2016-01-03 DIAGNOSIS — L608 Other nail disorders: Secondary | ICD-10-CM

## 2016-01-03 DIAGNOSIS — B351 Tinea unguium: Secondary | ICD-10-CM

## 2016-01-03 DIAGNOSIS — L603 Nail dystrophy: Secondary | ICD-10-CM

## 2016-01-05 NOTE — Progress Notes (Signed)
SUBJECTIVE Patient  presents to office today complaining of elongated, thickened nails. Pain while ambulating in shoes. Patient is unable to trim their own nails.   OBJECTIVE General Patient is awake, alert, and oriented x 3 and in no acute distress. Derm Skin is dry and supple bilateral. Negative open lesions or macerations. Remaining integument unremarkable. Nails are tender, long, thickened and dystrophic with subungual debris, consistent with onychomycosis, 1-5 bilateral. No signs of infection noted. Vasc  DP and PT pedal pulses palpable bilaterally. Temperature gradient within normal limits.  Neuro Epicritic and protective threshold sensation diminished bilaterally.  Musculoskeletal Exam No symptomatic pedal deformities noted bilateral. Muscular strength within normal limits.  ASSESSMENT 1. Onychodystrophic nails 1-5 bilateral with hyperkeratosis of nails.  2. Onychomycosis of nail due to dermatophyte bilateral 3. Pain in foot bilateral  PLAN OF CARE 1. Patient evaluated today.  2. Instructed to maintain good pedal hygiene and foot care.  3. Mechanical debridement of nails 1-5 bilaterally performed using a nail nipper. Filed with dremel without incident.  4. Return to clinic in 3 mos.    Yocelin Vanlue M Rakesha Dalporto, DPM    

## 2016-01-06 ENCOUNTER — Encounter (INDEPENDENT_AMBULATORY_CARE_PROVIDER_SITE_OTHER): Payer: Self-pay | Admitting: Vascular Surgery

## 2016-01-06 ENCOUNTER — Ambulatory Visit (INDEPENDENT_AMBULATORY_CARE_PROVIDER_SITE_OTHER): Payer: Medicare Other | Admitting: Vascular Surgery

## 2016-01-06 VITALS — BP 141/86 | HR 64 | Resp 17 | Ht 74.0 in | Wt 280.0 lb

## 2016-01-06 DIAGNOSIS — I714 Abdominal aortic aneurysm, without rupture, unspecified: Secondary | ICD-10-CM

## 2016-01-06 DIAGNOSIS — I712 Thoracic aortic aneurysm, without rupture, unspecified: Secondary | ICD-10-CM

## 2016-01-06 DIAGNOSIS — I10 Essential (primary) hypertension: Secondary | ICD-10-CM

## 2016-01-06 DIAGNOSIS — E782 Mixed hyperlipidemia: Secondary | ICD-10-CM | POA: Diagnosis not present

## 2016-01-06 DIAGNOSIS — M7989 Other specified soft tissue disorders: Secondary | ICD-10-CM

## 2016-01-06 DIAGNOSIS — I890 Lymphedema, not elsewhere classified: Secondary | ICD-10-CM | POA: Diagnosis not present

## 2016-01-06 NOTE — Progress Notes (Signed)
MRN : [id number]  Daniel Estes is a 75 y.o. ([birth year]) male who presents with chief complaint of  Chief Complaint  Patient presents with  . Re-evaluation    1 year follow up  .  History of Present Illness: The patient returns to the office for followup evaluation regarding leg swelling.  The swelling has persisted and the pain associated with swelling continues. There have not been any interval development of a ulcerations or wounds.  Since the previous visit the patient has been wearing graduated compression stockings 20-30 mmHg and has noted only moderate improvement in the lymphedema. The patient has been using compression routinely morning until night.  The patient also states elevation during the day and exercise is being done too.   Current Meds  Medication Sig  . abiraterone Acetate (ZYTIGA) 250 MG tablet   . Acetaminophen 500 MG coapsule Take by mouth.  Marland Kitchen amLODipine (NORVASC) 10 MG tablet Take 10 mg by mouth daily.  Marland Kitchen atenolol (TENORMIN) 100 MG tablet Take 100 mg by mouth daily.  Marland Kitchen gabapentin (NEURONTIN) 100 MG capsule Take two capsules by mouth at bedtime.  . hydrochlorothiazide (HYDRODIURIL) 12.5 MG tablet   . Ibuprofen (RA IBUPROFEN) 200 MG CAPS Take by mouth.  . Leuprolide Acetate, 6 Month, (LUPRON) 45 MG injection Inject into the muscle.  . losartan (COZAAR) 100 MG tablet Take 100 mg by mouth daily.  . meloxicam (MOBIC) 15 MG tablet   . mirabegron ER (MYRBETRIQ) 25 MG TB24 tablet Take by mouth.  . Multiple Vitamin (MULTIVITAMIN) capsule Take by mouth.  . potassium chloride (K-DUR) 10 MEQ tablet   . predniSONE (DELTASONE) 5 MG tablet Take by mouth.    Past Medical History:  Diagnosis Date  . Hyperlipidemia   . Hypertension     No past surgical history on file.  Social History Social History  Substance Use Topics  . Smoking status: Never Smoker  . Smokeless tobacco: Never Used  . Alcohol use 0.0 oz/week    Family History Family History  Problem  Relation Age of Onset  . Hypertension Mother   . Hypertension Father   . Hyperlipidemia Father   No family history of bleeding/clotting disorders, porphyria or autoimmune disease   No Known Allergies   REVIEW OF SYSTEMS (Negative unless checked)  Constitutional: [] Weight loss  [] Fever  [] Chills Cardiac: [] Chest pain   [] Chest pressure   [] Palpitations   [] Shortness of breath when laying flat   [] Shortness of breath with exertion. Vascular:  [] Pain in legs with walking   [] Pain in legs at rest  [] History of DVT   [] Phlebitis   [x] Swelling in legs   [] Varicose veins   [] Non-healing ulcers Pulmonary:   [] Uses home oxygen   [] Productive cough   [] Hemoptysis   [] Wheeze  [] COPD   [] Asthma Neurologic:  [] Dizziness   [] Seizures   [] History of stroke   [] History of TIA  [] Aphasia   [] Vissual changes   [] Weakness or numbness in arm   [] Weakness or numbness in leg Musculoskeletal:   [] Joint swelling   [] Joint pain   [x] Low back pain Hematologic:  [] Easy bruising  [] Easy bleeding   [] Hypercoagulable state   [] Anemic Gastrointestinal:  [] Diarrhea   [] Vomiting  [] Gastroesophageal reflux/heartburn   [] Difficulty swallowing. Genitourinary:  [] Chronic kidney disease   [x] Difficult urination  [x] Frequent urination   [] Blood in urine Skin:  [] Rashes   [] Ulcers  Psychological:  [] History of anxiety   []  History of major depression.  Physical Examination  Vitals:  01/06/16 0814  BP: (!) 141/86  Pulse: 64  Resp: 17  Weight: 280 lb (127 kg)  Height: 6\' 2"  (1.88 m)   Body mass index is 35.95 kg/m. Gen: WD/WN, NAD Head: [place]/AT, No temporalis wasting.  Ear/Nose/Throat: Hearing grossly intact, nares w/o erythema or drainage, poor dentition Eyes: PER, EOMI, sclera nonicteric.  Neck: Supple, no masses.  No bruit or JVD.  Pulmonary:  Good air movement, clear to auscultation bilaterally, no use of accessory muscles.  Cardiac: RRR, normal S1, S2, no Murmurs. Vascular: 3+ soft pitting edema  bilaterally Vessel Right Left  Radial Palpable Palpable  Ulnar Palpable Palpable  Brachial Palpable Palpable  Carotid Palpable Palpable  Femoral Palpable Palpable  Popliteal Palpable Palpable  PT Palpable Palpable  DP Palpable Palpable   Gastrointestinal: soft, non-distended. No guarding/no peritoneal signs.  Musculoskeletal: M/S 5/5 throughout.  No deformity or atrophy.  Neurologic: CN 2-12 intact. Pain and light touch intact in extremities.  Symmetrical.  Speech is fluent. Motor exam as listed above. Psychiatric: Judgment intact, Mood & affect appropriate for pt's clinical situation. Dermatologic: No rashes or ulcers noted.  No changes consistent with cellulitis. Lymph : No Cervical lymphadenopathy, no lichenification or skin changes of chronic lymphedema.  CBC Lab Results  Component Value Date   WBC 6.2 10/17/2015   HGB 13.3 10/17/2015   HCT 38.3 (L) 10/17/2015   MCV 95.7 10/17/2015   PLT 258 10/17/2015    BMET    Component Value Date/Time   NA 136 10/17/2015 2123   NA 137 05/20/2013 0642   K 4.0 10/17/2015 2123   K 3.6 05/20/2013 0642   CL 97 (L) 10/17/2015 2123   CL 102 05/20/2013 0642   CO2 34 (H) 10/17/2015 2123   CO2 28 05/20/2013 0642   GLUCOSE 121 (H) 10/17/2015 2123   GLUCOSE 100 (H) 05/20/2013 0642   BUN 14 10/17/2015 2123   BUN 7 05/20/2013 0642   CREATININE 0.96 10/17/2015 2123   CREATININE 1.01 05/20/2013 0642   CALCIUM 9.2 10/17/2015 2123   CALCIUM 8.7 05/20/2013 0642   GFRNONAA >60 10/17/2015 2123   GFRNONAA >60 05/20/2013 0642   GFRAA >60 10/17/2015 2123   GFRAA >60 05/20/2013 0642   CrCl cannot be calculated (Patient's most recent lab result is older than the maximum 21 days allowed.).  COAG No results found for: INR, PROTIME  Radiology No results found.  Outside Studies/Documentation 9 pages of outside documents were reviewed.  They showed he was started on HCTZ at his last urology appointment for swelling.  He state no  effect  Assessment/Plan 1. Lymphedema Recommend:  No surgery or intervention at this point in time.    I have reviewed my previous discussion with the patient regarding swelling and why it causes symptoms.  Patient will continue wearing graduated compression stockings class 1 (20-30 mmHg) on a daily basis. The patient will  beginning wearing the stockings first thing in the morning and removing them in the evening. The patient is instructed specifically not to sleep in the stockings.    In addition, behavioral modification including several periods of elevation of the lower extremities during the day will be continued.  This was reviewed with the patient during the initial visit.  The patient will also continue routine exercise, especially walking on a daily basis as was discussed during the initial visit.    Since graduated compression therapy and behavioral modification has not yielded adequate control of the lymphedema I believe that a lymph pump should  be added to improve the control of the patient's lymphedema.  Additionally, a lymph pump is warranted because it will reduce the risk of cellulitis and ulceration in the future.  Patient should follow-up in six months    2. Limb swelling See #1  3. Abdominal aortic aneurysm (AAA) without rupture (HCC) Impression: the patient is followed for his aneurysm by Dr Ysidro Evert at [facility] 4. Thoracic aortic aneurysm without rupture ([place]) See #3  5. Mixed hyperlipidemia Continue statin as ordered and reviewed, no changes at this time  6. Essential hypertension Continue antihypertensive medications as already ordered and reviewed, no changes at this time.      Hortencia Pilar, MD  01/06/2016 8:26 AM

## 2016-01-30 ENCOUNTER — Other Ambulatory Visit: Payer: Self-pay

## 2016-01-30 NOTE — Telephone Encounter (Signed)
Faxed refill request back to pharmacy not [facility] patient or provider

## 2016-04-16 ENCOUNTER — Encounter: Payer: Self-pay | Admitting: Emergency Medicine

## 2016-04-16 ENCOUNTER — Observation Stay
Admission: EM | Admit: 2016-04-16 | Discharge: 2016-04-17 | Disposition: A | Discharge status: Home / Self Care | Payer: Medicare Other | Attending: Internal Medicine | Admitting: Internal Medicine

## 2016-04-16 DIAGNOSIS — D701 Agranulocytosis secondary to cancer chemotherapy: Secondary | ICD-10-CM | POA: Diagnosis present

## 2016-04-16 DIAGNOSIS — B370 Candidal stomatitis: Secondary | ICD-10-CM | POA: Insufficient documentation

## 2016-04-16 DIAGNOSIS — C61 Malignant neoplasm of prostate: Secondary | ICD-10-CM | POA: Insufficient documentation

## 2016-04-16 DIAGNOSIS — Z8249 Family history of ischemic heart disease and other diseases of the circulatory system: Secondary | ICD-10-CM | POA: Insufficient documentation

## 2016-04-16 DIAGNOSIS — R531 Weakness: Secondary | ICD-10-CM | POA: Diagnosis not present

## 2016-04-16 DIAGNOSIS — E669 Obesity, unspecified: Secondary | ICD-10-CM | POA: Diagnosis not present

## 2016-04-16 DIAGNOSIS — Z9221 Personal history of antineoplastic chemotherapy: Secondary | ICD-10-CM | POA: Insufficient documentation

## 2016-04-16 DIAGNOSIS — R5383 Other fatigue: Secondary | ICD-10-CM | POA: Insufficient documentation

## 2016-04-16 DIAGNOSIS — E785 Hyperlipidemia, unspecified: Secondary | ICD-10-CM | POA: Insufficient documentation

## 2016-04-16 DIAGNOSIS — K922 Gastrointestinal hemorrhage, unspecified: Secondary | ICD-10-CM | POA: Diagnosis present

## 2016-04-16 DIAGNOSIS — I712 Thoracic aortic aneurysm, without rupture: Secondary | ICD-10-CM | POA: Diagnosis not present

## 2016-04-16 DIAGNOSIS — R32 Unspecified urinary incontinence: Secondary | ICD-10-CM | POA: Insufficient documentation

## 2016-04-16 DIAGNOSIS — K921 Melena: Secondary | ICD-10-CM | POA: Diagnosis not present

## 2016-04-16 DIAGNOSIS — I714 Abdominal aortic aneurysm, without rupture: Secondary | ICD-10-CM | POA: Insufficient documentation

## 2016-04-16 DIAGNOSIS — N179 Acute kidney failure, unspecified: Secondary | ICD-10-CM | POA: Insufficient documentation

## 2016-04-16 DIAGNOSIS — M791 Myalgia: Secondary | ICD-10-CM | POA: Diagnosis not present

## 2016-04-16 DIAGNOSIS — Z79899 Other long term (current) drug therapy: Secondary | ICD-10-CM | POA: Insufficient documentation

## 2016-04-16 DIAGNOSIS — G629 Polyneuropathy, unspecified: Secondary | ICD-10-CM | POA: Insufficient documentation

## 2016-04-16 DIAGNOSIS — I890 Lymphedema, not elsewhere classified: Secondary | ICD-10-CM | POA: Insufficient documentation

## 2016-04-16 DIAGNOSIS — I10 Essential (primary) hypertension: Secondary | ICD-10-CM | POA: Insufficient documentation

## 2016-04-16 DIAGNOSIS — T451X5A Adverse effect of antineoplastic and immunosuppressive drugs, initial encounter: Secondary | ICD-10-CM

## 2016-04-16 DIAGNOSIS — N210 Calculus in bladder: Secondary | ICD-10-CM | POA: Diagnosis not present

## 2016-04-16 DIAGNOSIS — Z6835 Body mass index (BMI) 35.0-35.9, adult: Secondary | ICD-10-CM | POA: Diagnosis not present

## 2016-04-16 DIAGNOSIS — D500 Iron deficiency anemia secondary to blood loss (chronic): Secondary | ICD-10-CM | POA: Insufficient documentation

## 2016-04-16 HISTORY — DX: Malignant neoplasm of prostate: C61

## 2016-04-16 LAB — DIFFERENTIAL
BLASTS: 0 %
Band Neutrophils: 0 %
Basophils Absolute: 0 10*3/uL (ref 0–0.1)
Basophils Relative: 1 %
EOS PCT: 0 %
Eosinophils Absolute: 0 10*3/uL (ref 0–0.7)
Lymphocytes Relative: 48 %
Lymphs Abs: 0.3 10*3/uL — ABNORMAL LOW (ref 1.0–3.6)
Metamyelocytes Relative: 0 %
Monocytes Absolute: 0.3 10*3/uL (ref 0.2–1.0)
Monocytes Relative: 48 %
Myelocytes: 0 %
NRBC: 0 /100{WBCs}
Neutro Abs: 0 10*3/uL — ABNORMAL LOW (ref 1.4–6.5)
Neutrophils Relative %: 3 %
Other: 0 %
Promyelocytes Absolute: 0 %
SMEAR REVIEW: ADEQUATE

## 2016-04-16 LAB — COMPREHENSIVE METABOLIC PANEL
ALT: 59 U/L (ref 17–63)
ANION GAP: 13 (ref 5–15)
AST: 56 U/L — ABNORMAL HIGH (ref 15–41)
Albumin: 3.5 g/dL (ref 3.5–5.0)
Alkaline Phosphatase: 48 U/L (ref 38–126)
BUN: 14 mg/dL (ref 6–20)
CO2: 23 mmol/L (ref 22–32)
Calcium: 8.6 mg/dL — ABNORMAL LOW (ref 8.9–10.3)
Chloride: 96 mmol/L — ABNORMAL LOW (ref 101–111)
Creatinine, Ser: 1.3 mg/dL — ABNORMAL HIGH (ref 0.61–1.24)
GFR calc non Af Amer: 53 mL/min — ABNORMAL LOW (ref >60)
Glucose, Bld: 129 mg/dL — ABNORMAL HIGH (ref 65–99)
POTASSIUM: 4 mmol/L (ref 3.5–5.1)
Sodium: 132 mmol/L — ABNORMAL LOW (ref 135–145)
Total Bilirubin: 1.4 mg/dL — ABNORMAL HIGH (ref 0.3–1.2)
Total Protein: 7.1 g/dL (ref 6.5–8.1)

## 2016-04-16 LAB — CBC
HEMATOCRIT: 35.9 % — AB (ref 40.0–52.0)
HEMOGLOBIN: 12.4 g/dL — AB (ref 13.0–18.0)
MCH: 32.3 pg (ref 26.0–34.0)
MCHC: 34.6 g/dL (ref 32.0–36.0)
MCV: 93.4 fL (ref 80.0–100.0)
Platelets: 250 10*3/uL (ref 150–440)
RBC: 3.84 MIL/uL — AB (ref 4.40–5.90)
RDW: 13.6 % (ref 11.5–14.5)
WBC: 0.6 10*3/uL — AB (ref 3.8–10.6)

## 2016-04-16 LAB — TYPE AND SCREEN
ABO/RH(D): O POS
ANTIBODY SCREEN: NEGATIVE

## 2016-04-16 MED ORDER — ONDANSETRON HCL 4 MG/2ML IJ SOLN: 4.0000 mg | Freq: Four times a day (QID) | INTRAMUSCULAR | Status: DC | PRN

## 2016-04-16 MED ORDER — DEXAMETHASONE 4 MG PO TABS
8.0000 mg | ORAL_TABLET | Freq: Two times a day (BID) | ORAL | Status: DC
  Administered 2016-04-16 – 2016-04-17 (×2): 8 mg via ORAL
  Filled 2016-04-16 (×2): qty 2

## 2016-04-16 MED ORDER — ATENOLOL 100 MG PO TABS
100.0000 mg | ORAL_TABLET | Freq: Every day | ORAL | Status: DC
  Administered 2016-04-17: 12:00:00 100 mg via ORAL
  Filled 2016-04-16: qty 1

## 2016-04-16 MED ORDER — SODIUM CHLORIDE 0.9 % IV BOLUS (SEPSIS)
1000.0000 mL | Freq: Once | INTRAVENOUS | Status: AC
  Administered 2016-04-16: 1000 mL via INTRAVENOUS

## 2016-04-16 MED ORDER — LOSARTAN POTASSIUM 50 MG PO TABS
100.0000 mg | ORAL_TABLET | Freq: Every day | ORAL | Status: DC
  Administered 2016-04-17: 12:00:00 100 mg via ORAL
  Filled 2016-04-16: qty 2

## 2016-04-16 MED ORDER — MIRABEGRON ER 25 MG PO TB24
25.0000 mg | ORAL_TABLET | Freq: Every day | ORAL | Status: DC
  Administered 2016-04-17: 25 mg via ORAL
  Filled 2016-04-16: qty 1

## 2016-04-16 MED ORDER — ADULT MULTIVITAMIN W/MINERALS CH
1.0000 | ORAL_TABLET | Freq: Every day | ORAL | Status: DC
  Administered 2016-04-16 – 2016-04-17 (×2): 1 via ORAL
  Filled 2016-04-16 (×2): qty 1

## 2016-04-16 MED ORDER — ACETAMINOPHEN 650 MG RE SUPP: 650.0000 mg | Freq: Four times a day (QID) | RECTAL | Status: DC | PRN

## 2016-04-16 MED ORDER — ACETAMINOPHEN 325 MG PO TABS
650.0000 mg | ORAL_TABLET | Freq: Four times a day (QID) | ORAL | Status: DC | PRN
  Administered 2016-04-16: 650 mg via ORAL
  Filled 2016-04-16: qty 2

## 2016-04-16 MED ORDER — AMLODIPINE BESYLATE 10 MG PO TABS
10.0000 mg | ORAL_TABLET | Freq: Every day | ORAL | Status: DC
  Administered 2016-04-17: 10 mg via ORAL
  Filled 2016-04-16: qty 1

## 2016-04-16 MED ORDER — GABAPENTIN 100 MG PO CAPS
200.0000 mg | ORAL_CAPSULE | Freq: Every day | ORAL | Status: DC
  Administered 2016-04-16: 200 mg via ORAL
  Filled 2016-04-16: qty 2

## 2016-04-16 MED ORDER — ONDANSETRON HCL 4 MG PO TABS: 4.0000 mg | ORAL_TABLET | Freq: Four times a day (QID) | ORAL | Status: DC | PRN

## 2016-04-16 MED ORDER — PANTOPRAZOLE SODIUM 40 MG IV SOLR
40.0000 mg | Freq: Two times a day (BID) | INTRAVENOUS | Status: DC
  Administered 2016-04-16 – 2016-04-17 (×2): 40 mg via INTRAVENOUS
  Filled 2016-04-16 (×2): qty 40

## 2016-04-16 NOTE — H&P (Signed)
[place] [facility] [place] NAME: Daniel Estes    MR#:  [id number]  DATE OF BIRTH:  1940-05-19  DATE OF ADMISSION:  04/16/2016  PRIMARY CARE PHYSICIAN: Salena Saner., MD   REQUESTING/REFERRING PHYSICIAN: Dr. Lisa Roca  CHIEF COMPLAINT:   Chief Complaint  Patient presents with  . Diarrhea  . Melena    HISTORY OF PRESENT ILLNESS:  Daniel Estes  is a 76 y.o. male with a known history of Prostate cancer ongoing treatment, hypertension, hyperlipidemia, neuropathy, who presented to the hospital today due to melanotic stools. Patient says that he is currently undergoing treatment for his prostate cancer has last chemotherapy about 2 weeks ago. For the past 3-4 days he has noticed black tarry stools, he's been feeling increasingly weak and lethargic with myalgias and therefore came to the ER for further evaluation. Patient was noted to have heme-positive stools by rectal exam in the ER, his hemoglobin although stable. Patient does say that he has been taking Motrin almost every other day for the past 6 months. He has taken about thousand to 1200 mg of Motrin for his nonspecific pain. Given his melanotic stools and upper GI bleed hospitalist services were contacted further treatment and evaluation. Patient denies any chest pain, nausea, vomiting, abdominal pain, hematuria or any other associated symptoms presently.  PAST MEDICAL HISTORY:   Past Medical History:  Diagnosis Date  . Hyperlipidemia   . Hypertension   . Prostate cancer ([place])     PAST SURGICAL HISTORY:   Past Surgical History:  Procedure Laterality Date  . APPENDECTOMY      SOCIAL HISTORY:   Social History  Substance Use Topics  . Smoking status: Never Smoker  . Smokeless tobacco: Never Used  . Alcohol use 0.0 oz/week    FAMILY HISTORY:   Family History  Problem Relation Age of Onset  . Hypertension Mother   . Hypertension Father   . Hyperlipidemia Father     DRUG  ALLERGIES:  No Known Allergies  REVIEW OF SYSTEMS:   Review of Systems  Constitutional: Negative for fever and weight loss.  HENT: Negative for congestion, nosebleeds and tinnitus.   Eyes: Negative for blurred vision, double vision and redness.  Respiratory: Negative for cough, hemoptysis and shortness of breath.   Cardiovascular: Negative for chest pain, orthopnea, leg swelling and PND.  Gastrointestinal: Positive for blood in stool and melena. Negative for abdominal pain, diarrhea, nausea and vomiting.  Genitourinary: Negative for dysuria, hematuria and urgency.  Musculoskeletal: Negative for falls and joint pain.  Neurological: Positive for weakness. Negative for dizziness, tingling, sensory change, focal weakness, seizures and headaches.  Endo/Heme/Allergies: Negative for polydipsia. Does not bruise/bleed easily.  Psychiatric/Behavioral: Negative for depression and memory loss. The patient is not nervous/anxious.     MEDICATIONS AT HOME:   Prior to Admission medications   Medication Sig Start Date End Date Taking? Authorizing Provider  Acetaminophen 500 MG coapsule Take by mouth.   Yes Historical Provider, MD  amLODipine (NORVASC) 10 MG tablet Take 10 mg by mouth daily.   Yes Historical Provider, MD  atenolol (TENORMIN) 100 MG tablet Take 100 mg by mouth daily.   Yes Historical Provider, MD  dexamethasone (DECADRON) 4 MG tablet Take 8 mg by mouth 2 (two) times daily. Take 8 mg by mouth twice daily before Docetaxel and twice daily the day after Docetaxel then take as directed. 03/30/16  Yes Historical Provider, MD  gabapentin (NEURONTIN) 100 MG capsule Take two capsules  by mouth at bedtime. 03/20/15  Yes Max T Hyatt, DPM  hydrochlorothiazide (HYDRODIURIL) 12.5 MG tablet  11/27/15  Yes Historical Provider, MD  Ibuprofen (RA IBUPROFEN) 200 MG CAPS Take by mouth.   Yes Historical Provider, MD  losartan (COZAAR) 100 MG tablet Take 100 mg by mouth daily.   Yes Historical Provider, MD   mirabegron ER (MYRBETRIQ) 25 MG TB24 tablet Take 25 mg by mouth daily.    Yes Historical Provider, MD  Multiple Vitamin (MULTIVITAMIN) capsule Take by mouth.   Yes Historical Provider, MD  omeprazole (PRILOSEC) 20 MG capsule Take 20 mg by mouth daily. 04/15/16 04/15/17 Yes Historical Provider, MD  potassium chloride (K-DUR) 10 MEQ tablet 2 (two) times daily.  12/16/15  Yes Historical Provider, MD  predniSONE (DELTASONE) 10 MG tablet Take 10 mg by mouth daily with breakfast. Hold on days taking dexamethasone. 01/16/15  Yes Historical Provider, MD  prochlorperazine (COMPAZINE) 10 MG tablet Take 10 mg by mouth every 6 (six) hours as needed for nausea/vomiting. 04/01/16  Yes Historical Provider, MD  abiraterone Acetate (ZYTIGA) 250 MG tablet  01/16/15   Historical Provider, MD  Leuprolide Acetate, 6 Month, (LUPRON) 45 MG injection Inject into the muscle.    Historical Provider, MD      VITAL SIGNS:  Blood pressure 115/73, pulse 73, temperature 98.7 F (37.1 C), temperature source Oral, resp. rate (!) 25, height 6\' 1"  (1.854 m), weight 122.5 kg (270 lb), SpO2 99 %.  PHYSICAL EXAMINATION:  Physical Exam  GENERAL:  76 y.o.-year-old patient lying in bed in no acute distress.  EYES: Pupils equal, round, reactive to light and accommodation. No scleral icterus. Extraocular muscles intact.  HEENT: Head atraumatic, normocephalic. Oropharynx and nasopharynx clear. No oropharyngeal erythema, moist oral mucosa  NECK:  Supple, no jugular venous distention. No thyroid enlargement, no tenderness.  LUNGS: Normal breath sounds bilaterally, no wheezing, rales, rhonchi. No use of accessory muscles of respiration.  CARDIOVASCULAR: S1, S2 RRR. No murmurs, rubs, gallops, clicks.  ABDOMEN: Soft, nontender, nondistended. Bowel sounds present. No organomegaly or mass.  EXTREMITIES: No pedal edema, cyanosis, or clubbing. + 2 pedal & radial pulses b/l.   NEUROLOGIC: Cranial nerves II through XII are intact. No focal Motor  or sensory deficits appreciated b/l PSYCHIATRIC: The patient is alert and oriented x 3.   SKIN: No obvious rash, lesion, or ulcer.   LABORATORY PANEL:   CBC  Recent Labs Lab 04/16/16 1158  WBC 0.6*  HGB 12.4*  HCT 35.9*  PLT 250   ------------------------------------------------------------------------------------------------------------------  Chemistries   Recent Labs Lab 04/16/16 1158  NA 132*  K 4.0  CL 96*  CO2 23  GLUCOSE 129*  BUN 14  CREATININE 1.30*  CALCIUM 8.6*  AST 56*  ALT 59  ALKPHOS 48  BILITOT 1.4*   ------------------------------------------------------------------------------------------------------------------  Cardiac Enzymes No results for input(s): TROPONINI in the last 168 hours. ------------------------------------------------------------------------------------------------------------------  RADIOLOGY:  No results found.   IMPRESSION AND PLAN:   76 year old male with past medical history of prostate cancer, hypertension, neuropathy who presents to the hospital due to melanotic stools.  1. Upper GI bleed-this is a suspected diagnosis given patient's melanotic stools and his recent long history of use of ibuprofen. -Patient's hemoglobin is currently stable. I will place him on a clear liquid diet for now, make him nothing by mouth after midnight. Follow serial Hg.  -Place on IV Protonix twice daily, we'll get a gastroenterology consult and a plan for endoscopy likely tomorrow.  2. Leukopenia-secondary to patient's  chemotherapy. -Place him on neutropenic precautions, get oncology consult as patient may benefit from Neulasta.  3. History of prostate cancer-follows primarily at [facility]. I will get a oncology consult.  4. Essential hypertension-continue losartan, atenolol, Norvasc.  5. Neuropathy-continue gabapentin.    All the records are reviewed and case discussed with ED provider. Management plans discussed with the patient, family  and they are in agreement.  CODE STATUS: Full  TOTAL TIME TAKING CARE OF THIS PATIENT: 45 minutes.    Henreitta Leber M.D on 04/16/2016 at 4:32 PM  Between 7am to 6pm - Pager - [phone]  After 6pm go to [email or web address] - password [email or web address] [facility]  [facility]  Office  [phone]  CC: Primary care physician; Salena Saner., MD

## 2016-04-16 NOTE — ED Triage Notes (Signed)
Says he is cancer patient.  Having black tarry stools for few days. Did have abd pain, but none now. Had been taking a lot of ibuprofen. His doctor told him to come for work up.  Also weakness and sob

## 2016-04-16 NOTE — ED Notes (Signed)
Communicated to Dr. Reita Cliche critical lab value: 0.6 serum WBC ; no further orders

## 2016-04-16 NOTE — ED Provider Notes (Signed)
[facility] Emergency Department Provider Note ____________________________________________   I have reviewed the triage vital signs and the triage nursing note.  HISTORY  Chief Complaint Diarrhea and Melena   Historian Patient, wife and son  HPI Shaye Lagace is a 76 y.o. male with history of metastatic prostate cancer, just started chemotherapy at [facility] oncology, first chemotherapy on March 7, a little over a week ago. Patient states that since that day he has had generalized fatigue, mild shortness of breath, body aches, no fevers or chills. He's had decreased by mouth intake and decreased appetite. The last 4 days he has noticed dark brown stool and at one point calls it tarry. When he discussed this over the phone with the health care provider he was referred to come into the ED for further evaluation.  No significant pain. No trouble breathing. No coughing. No chest pain. No near syncope.    Past Medical History:  Diagnosis Date  . Hyperlipidemia   . Hypertension   . Prostate cancer [facility])     Patient Active Problem List   Diagnosis Date Noted  . Lymphedema 01/06/2016  . Class 1 obesity 12/31/2014  . Hormone refractory prostate cancer ([place]) 10/10/2014  . Primary malignant neoplasm of prostate ([place]) 10/10/2014  . Bladder calculi 09/12/2014  . Patient refusal of treatment 11/15/2012  . Encounter for screening for malignant neoplasm of intestinal tract 08/04/2012  . Abdominal aortic aneurysm (AAA) without rupture ([place]) 04/13/2012  . Aneurysm of thoracic aorta ([place]) 04/13/2012  . HLD (hyperlipidemia) 04/13/2012  . Hematoma 04/13/2012  . Bladder retention 04/13/2012  . Injury of shoulder 01/18/2012  . Limb swelling 01/18/2012  . Absence of bladder continence 12/30/2011  . Persons encountering health services in other specified circumstances 12/30/2011  . BP (high blood pressure) 12/30/2011    Past Surgical History:  Procedure Laterality Date  .  APPENDECTOMY      Prior to Admission medications   Medication Sig Start Date End Date Taking? Authorizing Provider  Multiple Vitamin (MULTIVITAMIN) capsule Take by mouth.   Yes Historical Provider, MD  potassium chloride (K-DUR) 10 MEQ tablet 2 (two) times daily.  12/16/15  Yes Historical Provider, MD  predniSONE (DELTASONE) 10 MG tablet Take 5 mg by mouth 2 (two) times daily with a meal.  01/16/15  Yes Historical Provider, MD  abiraterone Acetate (ZYTIGA) 250 MG tablet  01/16/15   Historical Provider, MD  Acetaminophen 500 MG coapsule Take by mouth.    Historical Provider, MD  amLODipine (NORVASC) 10 MG tablet Take 10 mg by mouth daily.    Historical Provider, MD  atenolol (TENORMIN) 100 MG tablet Take 100 mg by mouth daily.    Historical Provider, MD  gabapentin (NEURONTIN) 100 MG capsule Take two capsules by mouth at bedtime. 03/20/15   Max T Hyatt, DPM  hydrochlorothiazide (HYDRODIURIL) 12.5 MG tablet  11/27/15   Historical Provider, MD  Ibuprofen (RA IBUPROFEN) 200 MG CAPS Take by mouth.    Historical Provider, MD  Leuprolide Acetate, 6 Month, (LUPRON) 45 MG injection Inject into the muscle.    Historical Provider, MD  losartan (COZAAR) 100 MG tablet Take 100 mg by mouth daily.    Historical Provider, MD  meloxicam (MOBIC) 15 MG tablet  12/30/15   Historical Provider, MD  mirabegron ER (MYRBETRIQ) 25 MG TB24 tablet Take by mouth.    Historical Provider, MD    No Known Allergies  Family History  Problem Relation Age of Onset  . Hypertension Mother   .  Hypertension Father   . Hyperlipidemia Father     Social History Social History  Substance Use Topics  . Smoking status: Never Smoker  . Smokeless tobacco: Never Used  . Alcohol use 0.0 oz/week    Review of Systems  Constitutional: Negative for fever. Eyes: Negative for visual changes. ENT: Negative for sore throat. Cardiovascular: Negative for chest pain. Respiratory: Occasional mild shortness of breath, but no recent  coughing or trouble breathing. Gastrointestinal: Negative for vomiting and diarrhea. Genitourinary: Negative for dysuria. Musculoskeletal: Negative for back pain. Skin: Negative for rash. Neurological: Negative for headache. 10 point Review of Systems otherwise negative ____________________________________________   PHYSICAL EXAM:  VITAL SIGNS: ED Triage Vitals  Enc Vitals Group     BP 04/16/16 1135 (!) 150/120     Pulse Rate 04/16/16 1135 (!) 112     Resp --      Temp 04/16/16 1135 98.7 F (37.1 C)     Temp Source 04/16/16 1135 Oral     SpO2 04/16/16 1135 96 %     Weight 04/16/16 1131 270 lb (122.5 kg)     Height 04/16/16 1131 6\' 1"  (1.854 m)     Head Circumference --      Peak Flow --      Pain Score --      Pain Loc --      Pain Edu? --      Excl. in [place]? --      Constitutional: Alert and oriented. Well appearing and in no distress. HEENT   Head: Normocephalic and atraumatic.      Eyes: Conjunctivae are normal. PERRL. Normal extraocular movements.      Ears:         Nose: No congestion/rhinnorhea.   Mouth/Throat: Mucous membranes are moist.   Neck: No stridor. Cardiovascular/Chest: Normal rate, regular rhythm.  No murmurs, rubs, or gallops. Respiratory: Normal respiratory effort without tachypnea nor retractions. Breath sounds are clear and equal bilaterally. No wheezes/rales/rhonchi. Gastrointestinal: Soft. No distention, no guarding, no rebound. Nontender.    Genitourinary/rectal: No thrombosed rectal hemorrhoids, small amount of dried stool just on the outside of the rectum that was sampled, and weakly heme positive. Digital Rectal exam was not attempted. Musculoskeletal: Nontender with normal range of motion in all extremities. No joint effusions.  No lower extremity tenderness.  No edema. Neurologic:  Normal speech and language. No gross or focal neurologic deficits are appreciated. Skin:  Skin is warm, dry and intact. No rash noted. Psychiatric: Mood  and affect are normal. Speech and behavior are normal. Patient exhibits appropriate insight and judgment.   ____________________________________________  LABS (pertinent positives/negatives)  Labs Reviewed  COMPREHENSIVE METABOLIC PANEL - Abnormal; Notable for the following:       Result Value   Sodium 132 (*)    Chloride 96 (*)    Glucose, Bld 129 (*)    Creatinine, Ser 1.30 (*)    Calcium 8.6 (*)    AST 56 (*)    Total Bilirubin 1.4 (*)    GFR calc non Af Amer 53 (*)    All other components within normal limits  CBC - Abnormal; Notable for the following:    WBC 0.6 (*)    RBC 3.84 (*)    Hemoglobin 12.4 (*)    HCT 35.9 (*)    All other components within normal limits  DIFFERENTIAL - Abnormal; Notable for the following:    Neutro Abs 0.0 (*)    Lymphs Abs 0.3 (*)  All other components within normal limits  TYPE AND SCREEN    ____________________________________________    EKG I, Lisa Roca, MD, the attending physician have personally viewed and interpreted all ECGs.  90 bpm. Narrow QRS. Nonspecific ST and T-wave ____________________________________________  RADIOLOGY All Xrays were viewed by me. Imaging interpreted by Radiologist.  None __________________________________________  PROCEDURES  Procedure(s) performed: None  Critical Care performed: None  ____________________________________________   ED COURSE / ASSESSMENT AND PLAN  Pertinent labs & imaging results that were available during my care of the patient were reviewed by me and considered in my medical decision making (see chart for details).   Patient not reporting any specific infectious symptoms. Many nonspecific fatigue site symptoms after patient's first chemotherapy, suspected may be due to side effect from the chemotherapy.  No reported fevers. Not febrile here. There is why he was told to come for evaluation was reported of 4 days of dark and tarry stools. His hemoglobin here is  reassuring at 12.4, when I review the note in care everywhere from [facility] visit they noted hemoglobin on March 7 was 13.4. So he has dropped 1 point. I did not do a full digital rectal exam given his neutropenia. There was some stool on the outside which I was able to Hemoccult and it was weakly positive.  His initial triage showed a slight tachycardia, on my exam he was in the 90s. One vital sign reevaluation showed a blood pressure in the 90s. His creatinine is 1.3 which is up from his baseline. I will order some IV fluids.  From the standpoint of the GI bleeding, seems to be stable for small amount. However he did drop 1 plan I think it's valuable to observe and recheck, and possible slight dehydration that this is also somewhat hemoconcentrated and that the actual hemoglobin is lower.  Due to the low blood pressure reading, I did check the cuff and it was too large for the patient, with adequate sized cuff, blood pressure 160 systolic.  Patient placed on neutropenic precautions.    CONSULTATIONS:   Dr. Mike Gip, oncology - recommends hospital observation admission. Without history of fever or document a fever, does not recommend antibiotics at this point time. If the patient develops a fever, cefepime is recommended.   Patient / Family / Caregiver informed of clinical course, medical decision-making process, and agree with plan.   ___________________________________________   FINAL CLINICAL IMPRESSION(S) / ED DIAGNOSES   Final diagnoses:  Chemotherapy-induced neutropenia ([place])  Acute GI bleeding              Note: This dictation was prepared with Dragon dictation. Any transcriptional errors that result from this process are unintentional    Lisa Roca, MD 04/16/16 1458

## 2016-04-17 DIAGNOSIS — K922 Gastrointestinal hemorrhage, unspecified: Secondary | ICD-10-CM | POA: Diagnosis not present

## 2016-04-17 DIAGNOSIS — I10 Essential (primary) hypertension: Secondary | ICD-10-CM

## 2016-04-17 DIAGNOSIS — D709 Neutropenia, unspecified: Secondary | ICD-10-CM

## 2016-04-17 DIAGNOSIS — Z791 Long term (current) use of non-steroidal anti-inflammatories (NSAID): Secondary | ICD-10-CM

## 2016-04-17 DIAGNOSIS — K921 Melena: Secondary | ICD-10-CM | POA: Diagnosis not present

## 2016-04-17 DIAGNOSIS — B370 Candidal stomatitis: Secondary | ICD-10-CM | POA: Diagnosis not present

## 2016-04-17 DIAGNOSIS — R109 Unspecified abdominal pain: Secondary | ICD-10-CM | POA: Diagnosis not present

## 2016-04-17 DIAGNOSIS — R42 Dizziness and giddiness: Secondary | ICD-10-CM

## 2016-04-17 DIAGNOSIS — Z79899 Other long term (current) drug therapy: Secondary | ICD-10-CM

## 2016-04-17 DIAGNOSIS — C61 Malignant neoplasm of prostate: Secondary | ICD-10-CM

## 2016-04-17 LAB — CBC WITH DIFFERENTIAL/PLATELET
BAND NEUTROPHILS: 7 %
BASOS PCT: 0 %
Basophils Absolute: 0 10*3/uL (ref 0–0.1)
Blasts: 0 %
EOS ABS: 0 10*3/uL (ref 0–0.7)
EOS PCT: 0 %
HCT: 34.1 % — ABNORMAL LOW (ref 40.0–52.0)
Hemoglobin: 11.7 g/dL — ABNORMAL LOW (ref 13.0–18.0)
LYMPHS ABS: 0.3 10*3/uL — AB (ref 1.0–3.6)
Lymphocytes Relative: 39 %
MCH: 31.8 pg (ref 26.0–34.0)
MCHC: 34.3 g/dL (ref 32.0–36.0)
MCV: 92.5 fL (ref 80.0–100.0)
MONO ABS: 0.3 10*3/uL (ref 0.2–1.0)
MONOS PCT: 33 %
Metamyelocytes Relative: 2 %
Myelocytes: 0 %
NEUTROS ABS: 0.3 10*3/uL — AB (ref 1.4–6.5)
Neutrophils Relative %: 19 %
OTHER: 0 %
Platelets: 246 10*3/uL (ref 150–440)
Promyelocytes Absolute: 0 %
RBC: 3.68 MIL/uL — ABNORMAL LOW (ref 4.40–5.90)
RDW: 13.8 % (ref 11.5–14.5)
WBC: 0.9 10*3/uL — CL (ref 3.8–10.6)
nRBC: 0 /100 WBC

## 2016-04-17 LAB — CBC
HEMATOCRIT: 33.4 % — AB (ref 40.0–52.0)
HEMOGLOBIN: 11.5 g/dL — AB (ref 13.0–18.0)
MCH: 32 pg (ref 26.0–34.0)
MCHC: 34.6 g/dL (ref 32.0–36.0)
MCV: 92.4 fL (ref 80.0–100.0)
Platelets: 242 10*3/uL (ref 150–440)
RBC: 3.61 MIL/uL — ABNORMAL LOW (ref 4.40–5.90)
RDW: 13.7 % (ref 11.5–14.5)
WBC: 0.7 10*3/uL — CL (ref 3.8–10.6)

## 2016-04-17 LAB — BASIC METABOLIC PANEL
Anion gap: 10 (ref 5–15)
BUN: 10 mg/dL (ref 6–20)
CHLORIDE: 101 mmol/L (ref 101–111)
CO2: 22 mmol/L (ref 22–32)
Calcium: 8.1 mg/dL — ABNORMAL LOW (ref 8.9–10.3)
Creatinine, Ser: 0.64 mg/dL (ref 0.61–1.24)
GFR calc Af Amer: >60 mL/min (ref >60)
GFR calc non Af Amer: >60 mL/min (ref >60)
Glucose, Bld: 157 mg/dL — ABNORMAL HIGH (ref 65–99)
POTASSIUM: 3.9 mmol/L (ref 3.5–5.1)
SODIUM: 133 mmol/L — AB (ref 135–145)

## 2016-04-17 MED ORDER — TBO-FILGRASTIM 480 MCG/0.8ML ~~LOC~~ SOSY
480.0000 ug | PREFILLED_SYRINGE | Freq: Every day | SUBCUTANEOUS | Status: DC
  Administered 2016-04-17: 480 ug via SUBCUTANEOUS
  Filled 2016-04-17: qty 0.8

## 2016-04-17 MED ORDER — OMEPRAZOLE 40 MG PO CPDR: 40.0000 mg | DELAYED_RELEASE_CAPSULE | Freq: Two times a day (BID) | ORAL | 0 refills | Status: DC

## 2016-04-17 MED ORDER — NYSTATIN 100000 UNIT/ML MT SUSP
5.0000 mL | Freq: Four times a day (QID) | OROMUCOSAL | Status: DC
  Administered 2016-04-17: 09:00:00 500000 [IU] via ORAL
  Filled 2016-04-17: qty 5

## 2016-04-17 MED ORDER — TRAMADOL HCL 50 MG PO TABS: 50.0000 mg | ORAL_TABLET | Freq: Two times a day (BID) | ORAL | 0 refills | Status: AC | PRN

## 2016-04-17 NOTE — Discharge Instructions (Signed)
Stop ibuprofen.  Please see your oncologist Next week for blood work

## 2016-04-17 NOTE — Progress Notes (Signed)
Patient discharged home per MD order. Prescription given to patient. All discharge instructions given and all questions answered. 

## 2016-04-17 NOTE — Discharge Summary (Signed)
[facility] NAME: Daniel Estes    MR#:  [id number]  DATE OF BIRTH:  17-Oct-1940  DATE OF ADMISSION:  04/16/2016 ADMITTING PHYSICIAN: Henreitta Leber, MD  DATE OF DISCHARGE: 04/17/2016 12:35 PM  PRIMARY CARE PHYSICIAN: Salena Saner., MD   ADMISSION DIAGNOSIS:  Acute GI bleeding [K92.2] Chemotherapy-induced neutropenia (HCC) [D70.1, T45.1X5A]  DISCHARGE DIAGNOSIS:  Active Problems:   GI bleed   SECONDARY DIAGNOSIS:   Past Medical History:  Diagnosis Date  . Hyperlipidemia   . Hypertension   . Prostate cancer ([place])      ADMITTING HISTORY  HISTORY OF PRESENT ILLNESS:  Daniel Estes  is a 76 y.o. male with a known history of Prostate cancer ongoing treatment, hypertension, hyperlipidemia, neuropathy, who presented to the hospital today due to melanotic stools. Patient says that he is currently undergoing treatment for his prostate cancer has last chemotherapy about 2 weeks ago. For the past 3-4 days he has noticed black tarry stools, he's been feeling increasingly weak and lethargic with myalgias and therefore came to the ER for further evaluation. Patient was noted to have heme-positive stools by rectal exam in the ER, his hemoglobin although stable. Patient does say that he has been taking Motrin almost every other day for the past 6 months. He has taken about thousand to 1200 mg of Motrin for his nonspecific pain. Given his melanotic stools and upper GI bleed hospitalist services were contacted further treatment and evaluation. Patient denies any chest pain, nausea, vomiting, abdominal pain, hematuria or any other associated symptoms presently.   HOSPITAL COURSE:   * Melena with anemia This is most likely due to scheduled ibuprofen. Patient was placed on IV twice a day PPI and ibuprofen stopped. Seen by GI Dr. Vicente Males who did not recommend EGD due to significant leukopenia. Through the hospital stay patient's hemoglobin remained  stable. No further melena in stool is light brown. No bright blood. Patient has improved well and will be discharged home after stopping his ibuprofen and starting him on twice a day PPI. I have given him tramadol prescription in place of ibuprofen  * Leukopenia due to chemotherapy. He did receive 1 dose of Neupogen after being seen by oncology. He has follow-up blood work at [facility]. on Monday and will see his oncologist at [facility].   Stable for discharge home.  CONSULTS OBTAINED:  Treatment Team:  Jonathon Bellows, MD Lequita Asal, MD Cammie Sickle, MD  DRUG ALLERGIES:  No Known Allergies  DISCHARGE MEDICATIONS:   Discharge Medication List as of 04/17/2016 12:00 PM    START taking these medications   Details  traMADol (ULTRAM) 50 MG tablet Take 1 tablet (50 mg total) by mouth every 12 (twelve) hours as needed for severe pain., Starting Fri 04/17/2016, Print      CONTINUE these medications which have CHANGED   Details  omeprazole (PRILOSEC) 40 MG capsule Take 1 capsule (40 mg total) by mouth 2 (two) times daily before a meal., Starting Fri 04/17/2016, Until Sat 04/17/2017, Normal      CONTINUE these medications which have NOT CHANGED   Details  Acetaminophen 500 MG coapsule Take by mouth., Historical Med    amLODipine (NORVASC) 10 MG tablet Take 10 mg by mouth daily., Historical Med    atenolol (TENORMIN) 100 MG tablet Take 100 mg by mouth daily., Historical Med    dexamethasone (DECADRON) 4 MG tablet Take 8 mg by mouth 2 (two)  times daily. Take 8 mg by mouth twice daily before Docetaxel and twice daily the day after Docetaxel then take as directed., Starting Mon 03/30/2016, Historical Med    gabapentin (NEURONTIN) 100 MG capsule Take two capsules by mouth at bedtime., Normal    hydrochlorothiazide (HYDRODIURIL) 12.5 MG tablet Starting Wed 11/27/2015, Historical Med    losartan (COZAAR) 100 MG tablet Take 100 mg by mouth daily., Historical Med    mirabegron  ER (MYRBETRIQ) 25 MG TB24 tablet Take 25 mg by mouth daily. , Historical Med    Multiple Vitamin (MULTIVITAMIN) capsule Take by mouth., Historical Med    potassium chloride (K-DUR) 10 MEQ tablet 2 (two) times daily. , Starting Mon 12/16/2015, Historical Med    predniSONE (DELTASONE) 10 MG tablet Take 10 mg by mouth daily with breakfast. Hold on days taking dexamethasone., Starting Wed 01/16/2015, Historical Med    prochlorperazine (COMPAZINE) 10 MG tablet Take 10 mg by mouth every 6 (six) hours as needed for nausea/vomiting., Starting Wed 04/01/2016, Historical Med    abiraterone Acetate (ZYTIGA) 250 MG tablet Starting Wed 01/16/2015, Historical Med    Leuprolide Acetate, 6 Month, (LUPRON) 45 MG injection Inject into the muscle., Historical Med      STOP taking these medications     Ibuprofen (RA IBUPROFEN) 200 MG CAPS         Today   VITAL SIGNS:  Blood pressure 115/72, pulse (!) 103, temperature 99 F (37.2 C), temperature source Oral, resp. rate 20, height 6\' 1"  (1.854 m), weight 123.7 kg (272 lb 12.8 oz), SpO2 98 %.  I/O:   Intake/Output Summary (Last 24 hours) at 04/17/16 1522 Last data filed at 04/17/16 0415  Gross per 24 hour  Intake                0 ml  Output              450 ml  Net             -450 ml    PHYSICAL EXAMINATION:  Physical Exam  GENERAL:  76 y.o.-year-old patient lying in the bed with no acute distress.  LUNGS: Normal breath sounds bilaterally, no wheezing, rales,rhonchi or crepitation. No use of accessory muscles of respiration.  CARDIOVASCULAR: S1, S2 normal. No murmurs, rubs, or gallops.  ABDOMEN: Soft, non-tender, non-distended. Bowel sounds present. No organomegaly or mass.  NEUROLOGIC: Moves all 4 extremities. PSYCHIATRIC: The patient is alert and oriented x 3.  SKIN: No obvious rash, lesion, or ulcer.   DATA REVIEW:   CBC  Recent Labs Lab 04/17/16 0930  WBC 0.9*  HGB 11.7*  HCT 34.1*  PLT 246    Chemistries   Recent  Labs Lab 04/16/16 1158 04/17/16 0504  NA 132* 133*  K 4.0 3.9  CL 96* 101  CO2 23 22  GLUCOSE 129* 157*  BUN 14 10  CREATININE 1.30* 0.64  CALCIUM 8.6* 8.1*  AST 56*  --   ALT 59  --   ALKPHOS 48  --   BILITOT 1.4*  --     Cardiac Enzymes No results for input(s): TROPONINI in the last 168 hours.  Microbiology Results  Results for orders placed or performed during the hospital encounter of 11/06/14  Urine culture     Status: None   Collection Time: 11/06/14  6:51 PM  Result Value Ref Range Status   Specimen Description URINE, CLEAN CATCH  Final   Special Requests NONE  Final   Culture  Final    30,000 COLONIES/mL GROUP B STREP(S.AGALACTIAE)ISOLATED Virtually 100% of S. agalactiae (Group B) strains are susceptible to Penicillin.  For Penicillin-allergic patients, Erythromycin (85-95% sensitive) and Clindamycin (80% sensitive) are drugs of choice. Contact microbiology lab to request sensitivities if  needed within 7 days.    Report Status 11/10/2014 FINAL  Final    RADIOLOGY:  No results found.  Follow up with PCP in 1 week.  Management plans discussed with the patient, family and they are in agreement.  CODE STATUS:     Code Status Orders        Start     Ordered   04/16/16 1750  Full code  Continuous     04/16/16 1749    Code Status History    Date Active Date Inactive Code Status Order ID Comments User Context   This patient has a current code status but no historical code status.      TOTAL TIME TAKING CARE OF THIS PATIENT ON DAY OF DISCHARGE: more than 30 minutes.   Hillary Bow R M.D on 04/17/2016 at 3:22 PM  Between 7am to 6pm - Pager - [phone]  After 6pm go to [email or web address] - password [email or web address]  Office  [phone]  CC: Primary care physician; Salena Saner., MD  Note: This dictation was prepared with Dragon dictation along with smaller phrase technology. Any transcriptional errors that result  from this process are unintentional.

## 2016-04-17 NOTE — Care Management Obs Status (Signed)
[place] NOTIFICATION   Patient Details  Name: Aydrien Froman MRN: [id number] Date of Birth: October 27, 1940   Medicare Observation Status Notification Given:  No (admitted observation less than 24 hours)    Beverly Sessions, RN 04/17/2016, 3:42 PM

## 2016-04-17 NOTE — Consult Note (Signed)
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  male history significant for castrate resistant prostate cancer most recently on docetaxel chemotherapy status post cycle #1 on March 7th [follows up with Dr. Iona Beard at [facility].   Patient noted to have abdominal discomfort and black stools. Patient noted to have dizzy spells. Hemoglobin dropped from a baseline of 13 to currently 11.7. Patient was also dehydrated; creatinine baseline 0.96; at admission 1.3. After hydration creatinine is back to baseline at 0.64. Not need any blood transfusions. Patient has baseline normal white count; however yesterday on admission white count was 0.6; ANC 0; platelets normal at 242. Patient has been evaluated by GI; given the neutropenia/stability of the hemoglobin/improvement of symptoms- upper endoscopy is on hold for now.  He feels much better. Appetite is improved. Denies any further black stools. Abdominal pain improved. Denies any fevers or chills. No nausea no vomiting. No ulcers in the mall. He has mild to moderate tingling and numbness in the legs.  ROS: A complete 10 point review of system is done which is negative except mentioned above in history of present illness  MEDICAL HISTORY:  Past Medical History:  Diagnosis Date  . Hyperlipidemia   . Hypertension   . Prostate cancer [facility])     SURGICAL HISTORY: Past Surgical History:  Procedure Laterality Date  . APPENDECTOMY      SOCIAL HISTORY: Social History   Social History  . Marital status: Married    Spouse name: N/A  . Number of children: N/A  . Years of education: N/A   Occupational History  . Not on file.   Social History Main Topics  . Smoking status: Never Smoker  . Smokeless tobacco: Never Used  .  Alcohol use 0.0 oz/week     Comment: occ  . Drug use: Unknown  . Sexual activity: Not Currently   Other Topics Concern  . Not on file   Social History Narrative  . No narrative on file    FAMILY HISTORY: Family History  Problem Relation Age of Onset  . Hypertension Mother   . Hypertension Father   . Hyperlipidemia Father     ALLERGIES:  has No Known Allergies.  MEDICATIONS:  Current Facility-Administered Medications  Medication Dose Route Frequency Provider Last Rate Last Dose  . acetaminophen (TYLENOL) tablet 650 mg  650 mg Oral Q6H PRN Henreitta Leber, MD   650 mg at 04/16/16 2145   Or  . acetaminophen (TYLENOL) suppository 650 mg  650 mg Rectal Q6H PRN Henreitta Leber, MD      . amLODipine (NORVASC) tablet 10 mg  10 mg Oral Daily Henreitta Leber, MD   10 mg at 04/17/16 1145  . atenolol (TENORMIN) tablet 100 mg  100 mg Oral Daily Henreitta Leber, MD   100 mg at 04/17/16 1146  . dexamethasone (DECADRON) tablet 8 mg  8 mg Oral BID Henreitta Leber, MD   8 mg at 04/17/16 1146  . gabapentin (NEURONTIN) capsule 200 mg  200 mg Oral QHS Henreitta Leber, MD   200 mg at 04/16/16 2137  . losartan (COZAAR) tablet 100 mg  100 mg Oral Daily Henreitta Leber, MD   100 mg at 04/17/16 1146  . mirabegron ER (MYRBETRIQ) tablet 25 mg  25 mg Oral Daily Vivek  Gaetano Net, MD   25 mg at 04/17/16 1145  . multivitamin with minerals tablet 1 tablet  1 tablet Oral Daily Henreitta Leber, MD   1 tablet at 04/17/16 1145  . nystatin (MYCOSTATIN) 100000 UNIT/ML suspension 500,000 Units  5 mL Oral QID Cammie Sickle, MD   500,000 Units at 04/17/16 0902  . ondansetron (ZOFRAN) tablet 4 mg  4 mg Oral Q6H PRN Henreitta Leber, MD       Or  . ondansetron (ZOFRAN) injection 4 mg  4 mg Intravenous Q6H PRN Henreitta Leber, MD      . pantoprazole (PROTONIX) injection 40 mg  40 mg Intravenous Q12H Henreitta Leber, MD   40 mg at 04/17/16 0902  . Tbo-Filgrastim (GRANIX) injection 480 mcg  480 mcg Subcutaneous Daily  Cammie Sickle, MD   480 mcg at 04/17/16 1142   Current Outpatient Prescriptions  Medication Sig Dispense Refill  . Acetaminophen 500 MG coapsule Take by mouth.    Marland Kitchen amLODipine (NORVASC) 10 MG tablet Take 10 mg by mouth daily.    Marland Kitchen atenolol (TENORMIN) 100 MG tablet Take 100 mg by mouth daily.    Marland Kitchen dexamethasone (DECADRON) 4 MG tablet Take 8 mg by mouth 2 (two) times daily. Take 8 mg by mouth twice daily before Docetaxel and twice daily the day after Docetaxel then take as directed.  6  . gabapentin (NEURONTIN) 100 MG capsule Take two capsules by mouth at bedtime. 60 capsule 11  . hydrochlorothiazide (HYDRODIURIL) 12.5 MG tablet     . losartan (COZAAR) 100 MG tablet Take 100 mg by mouth daily.    . mirabegron ER (MYRBETRIQ) 25 MG TB24 tablet Take 25 mg by mouth daily.     . Multiple Vitamin (MULTIVITAMIN) capsule Take by mouth.    . potassium chloride (K-DUR) 10 MEQ tablet 2 (two) times daily.     . predniSONE (DELTASONE) 10 MG tablet Take 10 mg by mouth daily with breakfast. Hold on days taking dexamethasone.    . prochlorperazine (COMPAZINE) 10 MG tablet Take 10 mg by mouth every 6 (six) hours as needed for nausea/vomiting.    Marland Kitchen abiraterone Acetate (ZYTIGA) 250 MG tablet     . Leuprolide Acetate, 6 Month, (LUPRON) 45 MG injection Inject into the muscle.    Marland Kitchen omeprazole (PRILOSEC) 40 MG capsule Take 1 capsule (40 mg total) by mouth 2 (two) times daily before a meal. 60 capsule 0  . traMADol (ULTRAM) 50 MG tablet Take 1 tablet (50 mg total) by mouth every 12 (twelve) hours as needed for severe pain. 20 tablet 0      .  PHYSICAL EXAMINATION:  Vitals:   04/16/16 2009 04/17/16 0417  BP: 136/76 115/72  Pulse: 99 (!) 103  Resp: 20 20  Temp: 99.5 F (37.5 C) 99 F (37.2 C)   Filed Weights   04/16/16 1131 04/16/16 1830  Weight: 270 lb (122.5 kg) 272 lb 12.8 oz (123.7 kg)    GENERAL: Well-nourished well-developed; Alert, no distress and comfortable.  Alone. EYES: no pallor or  icterus OROPHARYNX: no  ulceration. Positive for oral thrush.  NECK: supple, no masses felt LYMPH:  no palpable lymphadenopathy in the cervical, axillary or inguinal regions LUNGS: decreased breath sounds to auscultation at bases and  No wheeze or crackles HEART/CVS: regular rate & rhythm and no murmurs; No lower extremity edema ABDOMEN: abdomen soft, non-tender and normal bowel sounds Musculoskeletal:no cyanosis of digits and no clubbing  PSYCH: alert &  oriented x 3 with fluent speech NEURO: no focal motor/sensory deficits SKIN:  no rashes or significant lesions  LABORATORY DATA:  I have reviewed the data as listed Lab Results  Component Value Date   WBC 0.9 (LL) 04/17/2016   HGB 11.7 (L) 04/17/2016   HCT 34.1 (L) 04/17/2016   MCV 92.5 04/17/2016   PLT 246 04/17/2016    Recent Labs  10/17/15 2123 04/16/16 1158 04/17/16 0504  NA 136 132* 133*  K 4.0 4.0 3.9  CL 97* 96* 101  CO2 34* 23 22  GLUCOSE 121* 129* 157*  BUN 14 14 10   CREATININE 0.96 1.30* 0.64  CALCIUM 9.2 8.6* 8.1*  GFRNONAA >60 53* >60  GFRAA >60 >60 >60  PROT  --  7.1  --   ALBUMIN  --  3.5  --   AST  --  56*  --   ALT  --  59  --   ALKPHOS  --  48  --   BILITOT  --  1.4*  --     RADIOGRAPHIC STUDIES: I have personally reviewed the radiological images as listed and agreed with the findings in the report. No results found.  ASSESSMENT & PLAN:   # 76 year old male patient with castrate resistant metastatic prostate cancer currently on docetaxel; currently admitted to the hospital for abdominal pain/melena- noted to have severe neutropenia.  # Severe neutropenia/ ANC 0 white count 0.7;Likely from chemotherapy. Patient has no fevers or chills. Recommend Granix 480 g today.    # Abdominal discomfort melena- question upper GI bleed. Symptoms resolved at this time. Hemoglobin fairly stable around 11. Currently on PPI. Status post GI evaluation. As per GI; hold Endoscopic evaluation at this time.  #  Metastatic prostate cancer status post cycle #1 of docetaxel on March 7. Follow up at [facility]. I have encouraged the patient to call the doctors at [facility]- regarding follow-up. He agrees. Prophylactic Neulasta could be considered for the current chemotherapy.  # Oral thrush- recommend Nystin swish/swallow.   All questions were answered. The patient knows to call the clinic with any problems, questions or concerns. Pt has labs planned on Monday/ March 19th thru his primary oncologist.   Thank you Dr. Darvin Neighbours for allowing me to participate in the care of your pleasant patient. Please do not hesitate to contact me with questions or concerns in the interim.    Cammie Sickle, MD 04/17/2016 1:07 PM

## 2016-04-17 NOTE — Consult Note (Signed)
Jonathon Bellows MD  [address]. [place], [place] [address] Phone: [phone] Fax : [phone]  Consultation  Referring Provider:     Dr Verdell Carmine Primary Care Physician:  Salena Saner., MD Primary Gastroenterologist: Dr Wynetta Emery [facility])   Reason for Consultation:     GI bleed  Date of Admission:  04/16/2016 Date of Consultation:  04/17/2016         HPI:   Daniel Estes is a 76 y.o. 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 On Docetaxel chemotherapy , presented to the ER yesterday with tarry black stools for the past 3-4 days . Long term use of Motrin for the past >6 months for back pains.   He says for the past 4 days noticed tarry black stools with crampy abdominal discomfort and bloating . Denies any hematemesis, Denies use of any blood thinners. After coming to the hospital last bowel movement was brown in color. Denies any fevers, diziness or lightheadedness   Past Medical History:  Diagnosis Date  . Hyperlipidemia   . Hypertension   . Prostate cancer [facility])     Past Surgical History:  Procedure Laterality Date  . APPENDECTOMY      Prior to Admission medications   Medication Sig Start Date End Date Taking? Authorizing Provider  Acetaminophen 500 MG coapsule Take by mouth.   Yes Historical Provider, MD  amLODipine (NORVASC) 10 MG tablet Take 10 mg by mouth daily.   Yes Historical Provider, MD  atenolol (TENORMIN) 100 MG tablet Take 100 mg by mouth daily.   Yes Historical Provider, MD  dexamethasone (DECADRON) 4 MG tablet Take 8 mg by mouth 2 (two) times daily. Take 8 mg by mouth twice daily before Docetaxel and twice daily the day after Docetaxel then take as directed. 03/30/16  Yes Historical Provider, MD  gabapentin (NEURONTIN) 100 MG capsule Take two capsules by mouth at bedtime. 03/20/15  Yes Max T Hyatt, DPM  hydrochlorothiazide (HYDRODIURIL) 12.5 MG tablet  11/27/15  Yes Historical Provider, MD  Ibuprofen (RA IBUPROFEN) 200 MG CAPS  Take by mouth.   Yes Historical Provider, MD  losartan (COZAAR) 100 MG tablet Take 100 mg by mouth daily.   Yes Historical Provider, MD  mirabegron ER (MYRBETRIQ) 25 MG TB24 tablet Take 25 mg by mouth daily.    Yes Historical Provider, MD  Multiple Vitamin (MULTIVITAMIN) capsule Take by mouth.   Yes Historical Provider, MD  omeprazole (PRILOSEC) 20 MG capsule Take 20 mg by mouth daily. 04/15/16 04/15/17 Yes Historical Provider, MD  potassium chloride (K-DUR) 10 MEQ tablet 2 (two) times daily.  12/16/15  Yes Historical Provider, MD  predniSONE (DELTASONE) 10 MG tablet Take 10 mg by mouth daily with breakfast. Hold on days taking dexamethasone. 01/16/15  Yes Historical Provider, MD  prochlorperazine (COMPAZINE) 10 MG tablet Take 10 mg by mouth every 6 (six) hours as needed for nausea/vomiting. 04/01/16  Yes Historical Provider, MD  abiraterone Acetate (ZYTIGA) 250 MG tablet  01/16/15   Historical Provider, MD  Leuprolide Acetate, 6 Month, (LUPRON) 45 MG injection Inject into the muscle.    Historical Provider, MD    Family History  Problem Relation Age of Onset  . Hypertension Mother   . Hypertension Father   . Hyperlipidemia Father      Social History  Substance Use Topics  . Smoking status: Never Smoker  . Smokeless tobacco: Never Used  . Alcohol use 0.0 oz/week     Comment: occ  Allergies as of 04/16/2016  . (No Known Allergies)    Review of Systems:    All systems reviewed and negative except where noted in HPI.   Physical Exam:  Vital signs in last 24 hours: Temp:  [97.5 F (36.4 C)-99.5 F (37.5 C)] 99 F (37.2 C) (03/16 0417) Pulse Rate:  [71-112] 103 (03/16 0417) Resp:  [16-26] 20 (03/16 0417) BP: (92-150)/(59-120) 115/72 (03/16 0417) SpO2:  [95 %-100 %] 98 % (03/16 0417) Weight:  [270 lb (122.5 kg)-272 lb 12.8 oz (123.7 kg)] 272 lb 12.8 oz (123.7 kg) (03/15 1830) Last BM Date: 04/16/16 General:   Pleasant, cooperative in NAD Head:  Normocephalic and  atraumatic. Eyes:   No icterus.   Conjunctiva pink. PERRLA. Ears:  Normal auditory acuity. Neck:  Supple; no masses or thyroidomegaly Lungs: Respirations even and unlabored. Lungs clear to auscultation bilaterally.   No wheezes, crackles, or rhonchi.  Heart:  Regular rate and rhythm;  Without murmur, clicks, rubs or gallops Abdomen:  Soft,mildly distended , nontender. Normal bowel sounds. No appreciable masses or hepatomegaly.  No rebound or guarding.  Rectal:  Not performed. Extremities:  Without edema, cyanosis or clubbing. Neurologic:  Alert and oriented x3;  grossly normal neurologically. Skin:  Intact without significant lesions or rashes. Cervical Nodes:  No significant cervical adenopathy. Psych:  Alert and cooperative. Normal affect.  LAB RESULTS:  Recent Labs  04/16/16 1158 04/17/16 0504  WBC 0.6* 0.7*  HGB 12.4* 11.5*  HCT 35.9* 33.4*  PLT 250 242   BMET  Recent Labs  04/16/16 1158 04/17/16 0504  NA 132* 133*  K 4.0 3.9  CL 96* 101  CO2 23 22  GLUCOSE 129* 157*  BUN 14 10  CREATININE 1.30* 0.64  CALCIUM 8.6* 8.1*   LFT  Recent Labs  04/16/16 1158  PROT 7.1  ALBUMIN 3.5  AST 56*  ALT 59  ALKPHOS 48  BILITOT 1.4*   PT/INR No results for input(s): LABPROT, INR in the last 72 hours.  STUDIES: No results found.    Impression / Plan:   Arnett Duddy is a 76 y.o. y/o male with with metastatic prostate cancer on chemotherapy admitted with GI bleed. Likely upper GI bleed secondary to long term NSAID use .    Also on admission noted to be in AKI which has improved overnight. Likely drop in HB overnight is due to hydration as the creatinine has also dropped by half. In patients with leucopenia and possible neutropenia , I would tend to avoid EGD due to risks of bacteremia unless actively bleeding although the absolute risks are not very clear. At this time would suggest to involve oncology to see if he would benefit from neupogen/neulasta if not already  received with chemotherapy.   His Jennings was 13 on 04/08/2016 . Appears he received docetaxel on 04/08/16 . Present leucopenia is acute . Likely secondary to chemootherapy. If its chemotherapy induced leucopenia/neutropenia then it should improve in a few days when it would be safer to perform an EGD. In the interim if he actively bleeds then we would have to proceed with EGD with antibiotic prophylaxis (ASGE recommendation).   At this time I would check a differential count, monitor CBC and continue PPI.Check H pylori stool antigen. Stop NSAID use   Thank you for involving me in the care of this patient.      LOS: 0 days   Jonathon Bellows, MD  04/17/2016, 8:04 AM

## 2016-04-20 ENCOUNTER — Ambulatory Visit: Payer: Medicare Other | Admitting: Podiatry

## 2016-04-28 ENCOUNTER — Other Ambulatory Visit: Payer: Self-pay

## 2016-04-28 MED ORDER — GABAPENTIN 100 MG PO CAPS: ORAL_CAPSULE | ORAL | 6 refills | Status: DC

## 2016-05-19 ENCOUNTER — Ambulatory Visit: Payer: Medicare Other | Admitting: Podiatry

## 2016-06-02 ENCOUNTER — Emergency Department
Admission: EM | Admit: 2016-06-02 | Discharge: 2016-06-02 | Disposition: A | Discharge status: Home / Self Care | Payer: Medicare Other | Attending: Emergency Medicine | Admitting: Emergency Medicine

## 2016-06-02 ENCOUNTER — Emergency Department: Payer: Medicare Other

## 2016-06-02 ENCOUNTER — Encounter: Payer: Self-pay | Admitting: Emergency Medicine

## 2016-06-02 DIAGNOSIS — R0789 Other chest pain: Secondary | ICD-10-CM | POA: Diagnosis not present

## 2016-06-02 DIAGNOSIS — Z79899 Other long term (current) drug therapy: Secondary | ICD-10-CM | POA: Insufficient documentation

## 2016-06-02 DIAGNOSIS — Z8546 Personal history of malignant neoplasm of prostate: Secondary | ICD-10-CM | POA: Diagnosis not present

## 2016-06-02 DIAGNOSIS — I10 Essential (primary) hypertension: Secondary | ICD-10-CM | POA: Diagnosis not present

## 2016-06-02 DIAGNOSIS — R079 Chest pain, unspecified: Secondary | ICD-10-CM

## 2016-06-02 LAB — COMPREHENSIVE METABOLIC PANEL
ALBUMIN: 3.7 g/dL (ref 3.5–5.0)
ALK PHOS: 76 U/L (ref 38–126)
ALT: 27 U/L (ref 17–63)
AST: 26 U/L (ref 15–41)
Anion gap: 8 (ref 5–15)
BUN: 13 mg/dL (ref 6–20)
CALCIUM: 8.7 mg/dL — AB (ref 8.9–10.3)
CO2: 26 mmol/L (ref 22–32)
CREATININE: 0.97 mg/dL (ref 0.61–1.24)
Chloride: 102 mmol/L (ref 101–111)
GFR calc Af Amer: >60 mL/min (ref >60)
GLUCOSE: 95 mg/dL (ref 65–99)
Potassium: 3.6 mmol/L (ref 3.5–5.1)
Sodium: 136 mmol/L (ref 135–145)
TOTAL PROTEIN: 7 g/dL (ref 6.5–8.1)
Total Bilirubin: 0.4 mg/dL (ref 0.3–1.2)

## 2016-06-02 LAB — CBC
HEMATOCRIT: 34.2 % — AB (ref 40.0–52.0)
HEMOGLOBIN: 11.6 g/dL — AB (ref 13.0–18.0)
MCH: 31.2 pg (ref 26.0–34.0)
MCHC: 33.8 g/dL (ref 32.0–36.0)
MCV: 92.2 fL (ref 80.0–100.0)
Platelets: 279 10*3/uL (ref 150–440)
RBC: 3.71 MIL/uL — AB (ref 4.40–5.90)
RDW: 17.1 % — AB (ref 11.5–14.5)
WBC: 9.1 10*3/uL (ref 3.8–10.6)

## 2016-06-02 LAB — TROPONIN I: Troponin I: <0.03 ng/mL (ref <0.03)

## 2016-06-02 MED ORDER — ASPIRIN 81 MG PO CHEW
324.0000 mg | CHEWABLE_TABLET | Freq: Once | ORAL | Status: AC
  Administered 2016-06-02: 324 mg via ORAL
  Filled 2016-06-02: qty 4

## 2016-06-02 NOTE — ED Triage Notes (Signed)
Patient to ER for c/o sudden onset of chest pain to left chest when he got up to use restroom at home. Patient denies any cardiac or lung history, despite being tachycardic and hypoxic in triage (sat 91%). Patient denies shortness of breath, dizziness, nausea, or diaphoresis.

## 2016-06-02 NOTE — ED Provider Notes (Signed)
[facility] Emergency Department Provider Note  ____________________________________________  Time seen: Approximately 4:34 AM  I have reviewed the triage vital signs and the nursing notes.   HISTORY  Chief Complaint Chest Pain    HPI Daniel Estes is a 76 y.o. male who complains of a brief episode of central chest pain described as tightness while he was in the process of getting up out of bed to go to the bathroom at home. This happened at about 12:00 AM today. Never had anything like this before. No shortness of breath diaphoresis vomiting or radiation. No dizziness or syncope. No falls or trauma. Not exertional, not pleuritic, no aggravating or alleviating factors. Pain lasted for about 5 seconds and then resolved on its own and has not reoccurred.     Past Medical History:  Diagnosis Date  . Hyperlipidemia   . Hypertension   . Prostate cancer [facility])      Patient Active Problem List   Diagnosis Date Noted  . GI bleed 04/16/2016  . Lymphedema 01/06/2016  . Class 1 obesity 12/31/2014  . 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 Primary malignant neoplasm of prostate ([place]) 10/10/2014  . Bladder calculi 09/12/2014  . Patient refusal of treatment 11/15/2012  . Encounter for screening for malignant neoplasm of intestinal tract 08/04/2012  . Abdominal aortic aneurysm (AAA) without rupture ([place]) 04/13/2012  . Aneurysm of thoracic aorta ([place]) 04/13/2012  . HLD (hyperlipidemia) 04/13/2012  . Hematoma 04/13/2012  . Bladder retention 04/13/2012  . Injury of shoulder 01/18/2012  . Limb swelling 01/18/2012  . Absence of bladder continence 12/30/2011  . Persons encountering health services in other specified circumstances 12/30/2011  . BP (high blood pressure) 12/30/2011     Past Surgical History:  Procedure Laterality Date  . APPENDECTOMY       Prior to Admission medications   Medication Sig Start Date End Date Taking? Authorizing  Provider  abiraterone Acetate (ZYTIGA) 250 MG tablet  01/16/15   Historical Provider, MD  Acetaminophen 500 MG coapsule Take by mouth.    Historical Provider, MD  amLODipine (NORVASC) 10 MG tablet Take 10 mg by mouth daily.    Historical Provider, MD  atenolol (TENORMIN) 100 MG tablet Take 100 mg by mouth daily.    Historical Provider, MD  dexamethasone (DECADRON) 4 MG tablet Take 8 mg by mouth 2 (two) times daily. Take 8 mg by mouth twice daily before Docetaxel and twice daily the day after Docetaxel then take as directed. 03/30/16   Historical Provider, MD  gabapentin (NEURONTIN) 100 MG capsule Take two capsules by mouth at bedtime. 04/28/16   Max T Hyatt, DPM  hydrochlorothiazide (HYDRODIURIL) 12.5 MG tablet  11/27/15   Historical Provider, MD  Leuprolide Acetate, 6 Month, (LUPRON) 45 MG injection Inject into the muscle.    Historical Provider, MD  losartan (COZAAR) 100 MG tablet Take 100 mg by mouth daily.    Historical Provider, MD  mirabegron ER (MYRBETRIQ) 25 MG TB24 tablet Take 25 mg by mouth daily.     Historical Provider, MD  Multiple Vitamin (MULTIVITAMIN) capsule Take by mouth.    Historical Provider, MD  omeprazole (PRILOSEC) 40 MG capsule Take 1 capsule (40 mg total) by mouth 2 (two) times daily before a meal. 04/17/16 04/17/17  Srikar Sudini, MD  potassium chloride (K-DUR) 10 MEQ tablet 2 (two) times daily.  12/16/15   Historical Provider, MD  predniSONE (DELTASONE) 10 MG tablet Take 10 mg by mouth daily with breakfast. Hold  on days taking dexamethasone. 01/16/15   Historical Provider, MD  prochlorperazine (COMPAZINE) 10 MG tablet Take 10 mg by mouth every 6 (six) hours as needed for nausea/vomiting. 04/01/16   Historical Provider, MD  traMADol (ULTRAM) 50 MG tablet Take 1 tablet (50 mg total) by mouth every 12 (twelve) hours as needed for severe pain. 04/17/16   Hillary Bow, MD     Allergies Patient has no known allergies.   Family History  Problem Relation Age of Onset  .  Hypertension Mother   . Hypertension Father   . Hyperlipidemia Father     Social History Social History  Substance Use Topics  . Smoking status: Never Smoker  . Smokeless tobacco: Never Used  . Alcohol use 0.0 oz/week     Comment: occ    Review of Systems  Constitutional:   No fever or chills.  ENT:   No sore throat. No rhinorrhea. Lymphatic: No swollen glands, No extremity swelling Endocrine: No hot/cold flashes. No significant weight change. No neck swelling. Cardiovascular:   Positive as above chest pain. Respiratory:   No dyspnea or cough. Gastrointestinal:   Negative for abdominal pain, vomiting and diarrhea.  Genitourinary:   Negative for dysuria or difficulty urinating. Musculoskeletal:   Negative for focal pain or swelling Neurological:   Negative for headaches or weakness. All other systems reviewed and are negative except as documented above in ROS and HPI.  ____________________________________________   PHYSICAL EXAM:  VITAL SIGNS: ED Triage Vitals  Enc Vitals Group     BP 06/02/16 0050 130/79     Pulse Rate 06/02/16 0050 (!) 103     Resp 06/02/16 0050 20     Temp 06/02/16 0050 99.2 F (37.3 C)     Temp Source 06/02/16 0050 Oral     SpO2 06/02/16 0050 91 %     Weight 06/02/16 0050 270 lb (122.5 kg)     Height 06/02/16 0050 6\' 1"  (1.854 m)     Head Circumference --      Peak Flow --      Pain Score 06/02/16 0049 9     Pain Loc --      Pain Edu? --      Excl. in [place]? --     Vital signs reviewed, nursing assessments reviewed.   Constitutional:   Alert and oriented. Well appearing and in no distress. Eyes:   No scleral icterus. No conjunctival pallor. PERRL. EOMI.  No nystagmus. ENT   Head:   Normocephalic and atraumatic.   Nose:   No congestion/rhinnorhea. No septal hematoma   Mouth/Throat:   MMM, no pharyngeal erythema. No peritonsillar mass.    Neck:   No stridor. No SubQ emphysema. No  meningismus. Hematological/Lymphatic/Immunilogical:   No cervical lymphadenopathy. Cardiovascular:   RRR. Symmetric bilateral radial and DP pulses.  No murmurs.  Respiratory:   Normal respiratory effort without tachypnea nor retractions. Breath sounds are clear and equal bilaterally. No wheezes/rales/rhonchi.Chest wall nontender Gastrointestinal:   Soft and nontender. Non distended. There is no CVA tenderness.  No rebound, rigidity, or guarding. Genitourinary:   deferred Musculoskeletal:   Normal range of motion in all extremities. No joint effusions.  No lower extremity tenderness.  No edema. Neurologic:   Normal speech and language.  CN 2-10 normal. Motor grossly intact. No gross focal neurologic deficits are appreciated.  Skin:    Skin is warm, dry and intact. No rash noted.  No petechiae, purpura, or bullae.  ____________________________________________    LABS (  pertinent positives/negatives) (all labs ordered are listed, but only abnormal results are displayed) Labs Reviewed  CBC - Abnormal; Notable for the following:       Result Value   RBC 3.71 (*)    Hemoglobin 11.6 (*)    HCT 34.2 (*)    RDW 17.1 (*)    All other components within normal limits  COMPREHENSIVE METABOLIC PANEL - Abnormal; Notable for the following:    Calcium 8.7 (*)    All other components within normal limits  TROPONIN I  TROPONIN I   ____________________________________________   EKG  Interpreted by me Sinus tachycardia rate 107, normal axis and intervals. Normal QRS ST segments and T waves.  ____________________________________________    MEBRAXENM  Dg Chest 2 View  Result Date: 06/02/2016 CLINICAL DATA:  Sudden onset of chest pain EXAM: CHEST  2 VIEW COMPARISON:  10/17/2015 FINDINGS: No pleural effusion. Streaky bibasilar atelectasis. No focal consolidation. Stable cardiomediastinal silhouette with atherosclerosis. No pneumothorax. IMPRESSION: Streaky bibasilar atelectasis.  No edema or  infiltrate. Electronically Signed   By: Donavan Foil M.D.   On: 06/02/2016 02:57    ____________________________________________   PROCEDURES Procedures  ____________________________________________   INITIAL IMPRESSION / ASSESSMENT AND PLAN / ED COURSE  Pertinent labs & imaging results that were available during my care of the patient were reviewed by me and considered in my medical decision making (see chart for details).   Clinical Course as of Jun 02 508  Tue Jun 02, 2016  0144 Brief 5 sec episode of atypical chest pain. Low susp acs, pe, dissection, ptx, pna, sepsis. Will check delta trop given age, anticipate dc home if workup unconcerning.   [PS]  0768 Second troponin pending. Vital signs remained normal. Patient remains chest pain-free. We'll discharge home if repeat troponin negative.  [PS]    Clinical Course User Index [PS] Carrie Mew, MD     ----------------------------------------- 5:10 AM on 06/02/2016 -----------------------------------------  Drift negative. Remains chest pain-free. We'll discharge. Considering the patient's symptoms, medical history, and physical examination today, I have low suspicion for ACS, PE, TAD, pneumothorax, carditis, mediastinitis, pneumonia, CHF, or sepsis.    ____________________________________________   FINAL CLINICAL IMPRESSION(S) / ED DIAGNOSES  Final diagnoses:  Nonspecific chest pain      New Prescriptions   No medications on file     Portions of this note were generated with dragon dictation software. Dictation errors may occur despite best attempts at proofreading.    Carrie Mew, MD 06/02/16 [id number]

## 2016-07-06 ENCOUNTER — Ambulatory Visit (INDEPENDENT_AMBULATORY_CARE_PROVIDER_SITE_OTHER): Payer: Medicare Other | Admitting: Vascular Surgery

## 2016-07-09 ENCOUNTER — Ambulatory Visit: Payer: Medicare Other | Admitting: Podiatry

## 2016-08-10 ENCOUNTER — Encounter: Payer: Self-pay | Admitting: Podiatry

## 2016-08-10 ENCOUNTER — Ambulatory Visit (INDEPENDENT_AMBULATORY_CARE_PROVIDER_SITE_OTHER): Payer: Medicare Other | Admitting: Podiatry

## 2016-08-10 DIAGNOSIS — M79609 Pain in unspecified limb: Secondary | ICD-10-CM | POA: Diagnosis not present

## 2016-08-10 DIAGNOSIS — B351 Tinea unguium: Secondary | ICD-10-CM | POA: Diagnosis not present

## 2016-08-10 NOTE — Progress Notes (Signed)
Complaint:  Visit Type: Patient returns to my office for continued preventative foot care services. 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  Patient says his nails are numb.. The patient presents for preventative foot care services. No changes to ROS  Podiatric Exam: Vascular: dorsalis pedis and posterior tibial pulses are palpable bilateral. Capillary return is immediate. Temperature gradient is WNL. Skin turgor WNL  Sensorium: Diminished  Semmes Weinstein monofilament test. Normal tactile sensation bilaterally. Nail Exam: Pt has thick disfigured discolored nails with subungual debris noted bilateral entire nail hallux through fifth toenails Ulcer Exam: There is no evidence of ulcer or pre-ulcerative changes or infection. Orthopedic Exam: Muscle tone and strength are WNL. No limitations in general ROM. No crepitus or effusions noted. Foot type and digits show no abnormalities. Bony prominences are unremarkable. Skin: No Porokeratosis. No infection or ulcers  Diagnosis:  Onychomycosis, , Pain in right toe, pain in left toes  Treatment & Plan Procedures and Treatment: Consent by patient was obtained for treatment procedures. The patient understood the discussion of treatment and procedures well. All questions were answered thoroughly reviewed. Debridement of mycotic and hypertrophic toenails, 1 through 5 bilateral and clearing of subungual debris. No ulceration, no infection noted.  Return Visit-Office Procedure: Patient instructed to return to the office for a follow up visit 3 months for continued evaluation and treatment.    Gardiner Barefoot DPM

## 2016-10-20 ENCOUNTER — Encounter: Payer: Self-pay | Admitting: Podiatry

## 2016-10-20 ENCOUNTER — Ambulatory Visit (INDEPENDENT_AMBULATORY_CARE_PROVIDER_SITE_OTHER): Payer: Medicare Other

## 2016-10-20 ENCOUNTER — Ambulatory Visit (INDEPENDENT_AMBULATORY_CARE_PROVIDER_SITE_OTHER): Payer: Medicare Other | Admitting: Podiatry

## 2016-10-20 DIAGNOSIS — G620 Drug-induced polyneuropathy: Secondary | ICD-10-CM | POA: Diagnosis not present

## 2016-10-20 DIAGNOSIS — R52 Pain, unspecified: Secondary | ICD-10-CM | POA: Diagnosis not present

## 2016-10-20 DIAGNOSIS — T451X5A Adverse effect of antineoplastic and immunosuppressive drugs, initial encounter: Secondary | ICD-10-CM

## 2016-10-20 MED ORDER — GABAPENTIN 600 MG PO TABS: 600.0000 mg | ORAL_TABLET | Freq: Three times a day (TID) | ORAL | 2 refills | Status: DC

## 2016-10-20 MED ORDER — NONFORMULARY OR COMPOUNDED ITEM: 2 refills | Status: DC

## 2016-10-23 NOTE — Progress Notes (Signed)
   HPI: Patient is a 76 year old male presenting today with a new complaint of numbness to the feet extending into the calves bilaterally that began approximately 4 months ago. He reports associated paresthesias, swelling and constant numbness. There are no modifying factors noted. He has been using an OTC pain cream with no significant relief. He is here for further evaluation and treatment.  Past Medical History:  Diagnosis Date  . Hyperlipidemia   . Hypertension   . Prostate cancer [facility])      Physical Exam: General: The patient is alert and oriented x3 in no acute distress.  Dermatology: Skin is warm, dry and supple bilateral lower extremities. Negative for open lesions or macerations.  Vascular: Palpable pedal pulses bilaterally. No edema or erythema noted. Capillary refill within normal limits.  Neurological: Epicritic and protective threshold absent bilaterally.   Musculoskeletal Exam: Range of motion within normal limits to all pedal and ankle joints bilateral. Muscle strength 5/5 in all groups bilateral.   Radiographic Exam:  Normal osseous mineralization. Joint spaces preserved. No fracture/dislocation/boney destruction.    Assessment: - Peripheral neuropathy secondary to chemotherapy   Plan of Care:  - Patient evaluated. X-rays reviewed. - Prescription for gabapentin 600 mg 3 times daily given to patient. - Prescription for peripheral neuropathy cream to be dispensed from [facility]. - Continue wearing compression socks. - Return to clinic when necessary.   Edrick Kins, DPM [facility]  Dr. Edrick Kins, DPM    [address], [place] [address]                Office [phone]  Fax [phone]

## 2016-11-09 ENCOUNTER — Ambulatory Visit: Payer: Medicare Other | Admitting: Podiatry

## 2016-11-11 ENCOUNTER — Other Ambulatory Visit: Payer: Self-pay | Admitting: Specialist

## 2016-11-11 DIAGNOSIS — M48061 Spinal stenosis, lumbar region without neurogenic claudication: Secondary | ICD-10-CM

## 2016-11-18 ENCOUNTER — Ambulatory Visit: Payer: PRIVATE HEALTH INSURANCE

## 2016-12-31 ENCOUNTER — Encounter: Payer: Self-pay | Admitting: Intensive Care

## 2016-12-31 ENCOUNTER — Inpatient Hospital Stay
Admission: EM | Admit: 2016-12-31 | Discharge: 2017-01-02 | DRG: 176 | Disposition: A | Discharge status: Hospice | Payer: Medicare Other | Attending: Internal Medicine | Admitting: Internal Medicine

## 2016-12-31 ENCOUNTER — Other Ambulatory Visit: Payer: Self-pay

## 2016-12-31 ENCOUNTER — Emergency Department: Payer: Medicare Other

## 2016-12-31 DIAGNOSIS — R609 Edema, unspecified: Secondary | ICD-10-CM

## 2016-12-31 DIAGNOSIS — E785 Hyperlipidemia, unspecified: Secondary | ICD-10-CM | POA: Diagnosis present

## 2016-12-31 DIAGNOSIS — Z79899 Other long term (current) drug therapy: Secondary | ICD-10-CM

## 2016-12-31 DIAGNOSIS — G629 Polyneuropathy, unspecified: Secondary | ICD-10-CM | POA: Diagnosis present

## 2016-12-31 DIAGNOSIS — Z7952 Long term (current) use of systemic steroids: Secondary | ICD-10-CM

## 2016-12-31 DIAGNOSIS — C799 Secondary malignant neoplasm of unspecified site: Secondary | ICD-10-CM | POA: Diagnosis not present

## 2016-12-31 DIAGNOSIS — Z8249 Family history of ischemic heart disease and other diseases of the circulatory system: Secondary | ICD-10-CM

## 2016-12-31 DIAGNOSIS — I872 Venous insufficiency (chronic) (peripheral): Secondary | ICD-10-CM | POA: Diagnosis present

## 2016-12-31 DIAGNOSIS — R42 Dizziness and giddiness: Secondary | ICD-10-CM | POA: Diagnosis not present

## 2016-12-31 DIAGNOSIS — I214 Non-ST elevation (NSTEMI) myocardial infarction: Secondary | ICD-10-CM | POA: Diagnosis not present

## 2016-12-31 DIAGNOSIS — Z79818 Long term (current) use of other agents affecting estrogen receptors and estrogen levels: Secondary | ICD-10-CM | POA: Diagnosis not present

## 2016-12-31 DIAGNOSIS — I2692 Saddle embolus of pulmonary artery without acute cor pulmonale: Secondary | ICD-10-CM | POA: Diagnosis present

## 2016-12-31 DIAGNOSIS — Z8349 Family history of other endocrine, nutritional and metabolic diseases: Secondary | ICD-10-CM | POA: Diagnosis not present

## 2016-12-31 DIAGNOSIS — Z66 Do not resuscitate: Secondary | ICD-10-CM | POA: Diagnosis present

## 2016-12-31 DIAGNOSIS — Z515 Encounter for palliative care: Secondary | ICD-10-CM | POA: Diagnosis present

## 2016-12-31 DIAGNOSIS — I10 Essential (primary) hypertension: Secondary | ICD-10-CM | POA: Diagnosis present

## 2016-12-31 DIAGNOSIS — Z7189 Other specified counseling: Secondary | ICD-10-CM | POA: Diagnosis not present

## 2016-12-31 DIAGNOSIS — I2699 Other pulmonary embolism without acute cor pulmonale: Secondary | ICD-10-CM

## 2016-12-31 DIAGNOSIS — Z8711 Personal history of peptic ulcer disease: Secondary | ICD-10-CM | POA: Diagnosis not present

## 2016-12-31 DIAGNOSIS — C61 Malignant neoplasm of prostate: Secondary | ICD-10-CM | POA: Diagnosis present

## 2016-12-31 DIAGNOSIS — I4891 Unspecified atrial fibrillation: Secondary | ICD-10-CM | POA: Diagnosis present

## 2016-12-31 DIAGNOSIS — I2602 Saddle embolus of pulmonary artery with acute cor pulmonale: Secondary | ICD-10-CM | POA: Diagnosis not present

## 2016-12-31 DIAGNOSIS — R0602 Shortness of breath: Secondary | ICD-10-CM | POA: Diagnosis not present

## 2016-12-31 LAB — COMPREHENSIVE METABOLIC PANEL
ALK PHOS: 45 U/L (ref 38–126)
ALT: 18 U/L (ref 17–63)
ANION GAP: 14 (ref 5–15)
AST: 61 U/L — ABNORMAL HIGH (ref 15–41)
Albumin: 3.5 g/dL (ref 3.5–5.0)
BILIRUBIN TOTAL: 1.2 mg/dL (ref 0.3–1.2)
BUN: 8 mg/dL (ref 6–20)
CALCIUM: 8.7 mg/dL — AB (ref 8.9–10.3)
CO2: 24 mmol/L (ref 22–32)
Chloride: 98 mmol/L — ABNORMAL LOW (ref 101–111)
Creatinine, Ser: 1 mg/dL (ref 0.61–1.24)
GFR calc non Af Amer: >60 mL/min (ref >60)
Glucose, Bld: 102 mg/dL — ABNORMAL HIGH (ref 65–99)
Potassium: 4.3 mmol/L (ref 3.5–5.1)
Sodium: 136 mmol/L (ref 135–145)
TOTAL PROTEIN: 7.2 g/dL (ref 6.5–8.1)

## 2016-12-31 LAB — CBC
HCT: 37.5 % — ABNORMAL LOW (ref 40.0–52.0)
HEMOGLOBIN: 12.5 g/dL — AB (ref 13.0–18.0)
MCH: 29.4 pg (ref 26.0–34.0)
MCHC: 33.3 g/dL (ref 32.0–36.0)
MCV: 88.4 fL (ref 80.0–100.0)
Platelets: 394 10*3/uL (ref 150–440)
RBC: 4.24 MIL/uL — AB (ref 4.40–5.90)
RDW: 15.8 % — ABNORMAL HIGH (ref 11.5–14.5)
WBC: 3.6 10*3/uL — AB (ref 3.8–10.6)

## 2016-12-31 LAB — INFLUENZA PANEL BY PCR (TYPE A & B)
INFLAPCR: NEGATIVE
Influenza B By PCR: NEGATIVE

## 2016-12-31 LAB — LIPASE, BLOOD: Lipase: 23 U/L (ref 11–51)

## 2016-12-31 LAB — PROTIME-INR
INR: 1.05
PROTHROMBIN TIME: 13.6 s (ref 11.4–15.2)

## 2016-12-31 LAB — APTT: APTT: 34 s (ref 24–36)

## 2016-12-31 LAB — TROPONIN I: TROPONIN I: 0.07 ng/mL — AB (ref <0.03)

## 2016-12-31 MED ORDER — ONDANSETRON 4 MG PO TBDP
4.0000 mg | ORAL_TABLET | Freq: Once | ORAL | Status: AC | PRN
  Administered 2016-12-31: 4 mg via ORAL
  Filled 2016-12-31: qty 1

## 2016-12-31 MED ORDER — METOPROLOL TARTRATE 25 MG PO TABS
25.0000 mg | ORAL_TABLET | Freq: Two times a day (BID) | ORAL | Status: DC
  Administered 2016-12-31 – 2017-01-02 (×4): 25 mg via ORAL
  Filled 2016-12-31 (×4): qty 1

## 2016-12-31 MED ORDER — ACETAMINOPHEN 500 MG PO TABS
500.0000 mg | ORAL_TABLET | Freq: Four times a day (QID) | ORAL | Status: DC | PRN
  Administered 2017-01-01 (×2): 500 mg via ORAL
  Filled 2016-12-31 (×2): qty 1

## 2016-12-31 MED ORDER — PROCHLORPERAZINE MALEATE 10 MG PO TABS
10.0000 mg | ORAL_TABLET | Freq: Four times a day (QID) | ORAL | Status: DC | PRN
  Administered 2016-12-31: 10 mg via ORAL
  Filled 2016-12-31 (×2): qty 1

## 2016-12-31 MED ORDER — GABAPENTIN 600 MG PO TABS
600.0000 mg | ORAL_TABLET | Freq: Three times a day (TID) | ORAL | Status: DC
  Administered 2016-12-31 – 2017-01-02 (×5): 600 mg via ORAL
  Filled 2016-12-31 (×5): qty 1

## 2016-12-31 MED ORDER — ASPIRIN 300 MG RE SUPP
300.0000 mg | Freq: Once | RECTAL | Status: AC
  Administered 2016-12-31: 300 mg via RECTAL
  Filled 2016-12-31: qty 1

## 2016-12-31 MED ORDER — PREDNISONE 10 MG PO TABS
10.0000 mg | ORAL_TABLET | Freq: Every day | ORAL | Status: DC
  Administered 2016-12-31 – 2017-01-02 (×3): 10 mg via ORAL
  Filled 2016-12-31 (×3): qty 1

## 2016-12-31 MED ORDER — ADULT MULTIVITAMIN W/MINERALS CH
1.0000 | ORAL_TABLET | Freq: Every day | ORAL | Status: DC
  Administered 2017-01-01 – 2017-01-02 (×2): 1 via ORAL
  Filled 2016-12-31 (×2): qty 1

## 2016-12-31 MED ORDER — SODIUM CHLORIDE 0.9 % IV BOLUS (SEPSIS)
1000.0000 mL | Freq: Once | INTRAVENOUS | Status: AC
  Administered 2016-12-31: 1000 mL via INTRAVENOUS

## 2016-12-31 MED ORDER — HEPARIN (PORCINE) IN NACL 100-0.45 UNIT/ML-% IJ SOLN
1450.0000 [IU]/h | INTRAMUSCULAR | Status: DC
  Administered 2016-12-31: 1450 [IU]/h via INTRAVENOUS
  Filled 2016-12-31 (×2): qty 250

## 2016-12-31 MED ORDER — ATENOLOL 25 MG PO TABS: 100.0000 mg | ORAL_TABLET | Freq: Every day | ORAL | Status: DC

## 2016-12-31 MED ORDER — IOPAMIDOL (ISOVUE-370) INJECTION 76%
100.0000 mL | Freq: Once | INTRAVENOUS | Status: AC | PRN
  Administered 2016-12-31: 100 mL via INTRAVENOUS

## 2016-12-31 MED ORDER — DOCUSATE SODIUM 100 MG PO CAPS
100.0000 mg | ORAL_CAPSULE | Freq: Two times a day (BID) | ORAL | Status: DC | PRN
  Administered 2016-12-31: 21:00:00 100 mg via ORAL
  Filled 2016-12-31: qty 1

## 2016-12-31 MED ORDER — HEPARIN BOLUS VIA INFUSION
4000.0000 [IU] | Freq: Once | INTRAVENOUS | Status: DC
  Filled 2016-12-31: qty 4000

## 2016-12-31 MED ORDER — TRAMADOL HCL 50 MG PO TABS: 50.0000 mg | ORAL_TABLET | Freq: Two times a day (BID) | ORAL | Status: DC | PRN

## 2016-12-31 MED ORDER — ASPIRIN 81 MG PO CHEW: 324.0000 mg | CHEWABLE_TABLET | Freq: Once | ORAL | Status: DC

## 2016-12-31 MED ORDER — HEPARIN (PORCINE) IN NACL 100-0.45 UNIT/ML-% IJ SOLN: 12.0000 [IU]/kg/h | INTRAMUSCULAR | Status: DC

## 2016-12-31 MED ORDER — PANTOPRAZOLE SODIUM 40 MG PO TBEC
40.0000 mg | DELAYED_RELEASE_TABLET | Freq: Every day | ORAL | Status: DC
  Administered 2016-12-31 – 2017-01-01 (×2): 40 mg via ORAL
  Filled 2016-12-31 (×2): qty 1

## 2016-12-31 NOTE — ED Provider Notes (Addendum)
[facility] Emergency Department Provider Note  ____________________________________________  Time seen: Approximately 2:04 PM  I have reviewed the triage vital signs and the nursing notes.   HISTORY  Chief Complaint Dizziness and Emesis    HPI Daniel Estes is a 76 y.o. male history of hypertension, hyperlipidemia, and metastatic prostate cancer not currently under treatment presenting for nausea vomiting and diarrhea, palpitations with heart racing, lightheadedness with standing.  The patient reports that for the last 3 days, he has been unable to keep anything down, with multiple daily episodes of nausea vomiting and loose nonbloody stool.  No associated abdominal pain, fevers or chills when he stands up or has any movement, he begins to have a heart racing sensation without any chest pain, pressure or discomfort.  He has associated lightheadedness and presyncope.  He denies any fever or chills.  He has had a mild nonproductive cough without sore throat, congestion or rhinorrhea or ear pain.  On arrival to the emergency department, the patient is found to be in atrial fibrillation with a rapid ventricular rate; he has no history of A. fib.  Past Medical History:  Diagnosis Date  . Hyperlipidemia   . Hypertension   . Prostate cancer [facility])    metastasized    Patient Active Problem List   Diagnosis Date Noted  . GI bleed 04/16/2016  . Lymphedema 01/06/2016  . Class 1 obesity 12/31/2014  . 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 Primary malignant neoplasm of prostate ([place]) 10/10/2014  . Bladder calculi 09/12/2014  . Patient refusal of treatment 11/15/2012  . Encounter for screening for malignant neoplasm of intestinal tract 08/04/2012  . Abdominal aortic aneurysm (AAA) without rupture ([place]) 04/13/2012  . Aneurysm of thoracic aorta ([place]) 04/13/2012  . HLD (hyperlipidemia) 04/13/2012  . Hematoma 04/13/2012  . Bladder retention 04/13/2012  .  Injury of shoulder 01/18/2012  . Limb swelling 01/18/2012  . Absence of bladder continence 12/30/2011  . Persons encountering health services in other specified circumstances 12/30/2011  . BP (high blood pressure) 12/30/2011    Past Surgical History:  Procedure Laterality Date  . APPENDECTOMY      Current Outpatient Rx  . Order #: [id number] Class: Historical Med  . Order #: [id number] Class: Historical Med  . Order #: [id number] Class: Historical Med  . Order #: [id number] Class: Historical Med  . Order #: [id number] Class: Normal  . Order #: [id number] Class: Historical Med  . Order #: [id number] Class: Historical Med  . Order #: [id number] Class: Historical Med  . Order #: [id number] Class: Historical Med  . Order #: [id number] Class: Print  . Order #: [id number] Class: Normal  . Order #: [id number] Class: Historical Med  . Order #: [id number] Class: Historical Med  . Order #: [id number] Class: Historical Med  . Order #: [id number] Class: Print    Allergies No known allergies  Family History  Problem Relation Age of Onset  . Hypertension Mother   . Hypertension Father   . Hyperlipidemia Father     Social History Social History   Tobacco Use  . Smoking status: Never Smoker  . Smokeless tobacco: Never Used  Substance Use Topics  . Alcohol use: No    Alcohol/week: 0.0 oz    Frequency: Never    Comment: occ  . Drug use: No    Review of Systems Constitutional: No fever/chills.  Positive lightheadedness with presyncope. Eyes: No visual changes.  No blurred or double vision. ENT: No sore throat. No congestion or rhinorrhea. Cardiovascular: Denies chest pain.  Positive palpitations. Respiratory: Positive  shortness of breath.  Positive nonproductive cough. Gastrointestinal: No abdominal pain.  Positive nausea, positive vomiting.  Positive diarrhea.  No constipation. Genitourinary: Negative for dysuria. Musculoskeletal: Negative for back pain. Skin: Negative for rash. Neurological:  Negative for headaches. No focal numbness, tingling or weakness.     ____________________________________________   PHYSICAL EXAM:  VITAL SIGNS: ED Triage Vitals  Enc Vitals Group     BP 12/31/16 1313 109/67     Pulse Rate 12/31/16 1313 (!) 131     Resp 12/31/16 1321 (!) 24     Temp 12/31/16 1313 97.7 F (36.5 C)     Temp Source 12/31/16 1313 Oral     SpO2 12/31/16 1313 93 %     Weight 12/31/16 1300 250 lb (113.4 kg)     Height 12/31/16 1300 6\' 2"  (1.88 m)     Head Circumference --      Peak Flow --      Pain Score 12/31/16 1300 8     Pain Loc --      Pain Edu? --      Excl. in [place]? --     Constitutional: Alert and oriented.  Chronically ill appearing but in no acute distress. Answers questions appropriately. Eyes: Conjunctivae are normal.  EOMI. No scleral icterus. Head: Atraumatic. Nose: No congestion/rhinnorhea. Mouth/Throat: Mucous membranes are mildly dry.  Neck: No stridor.  Supple.  No JVD.  No meningismus. Cardiovascular: Fast rate, irregular rhythm. No murmurs, rubs or gallops.  Respiratory: Normal respiratory effort.  No accessory muscle use or retractions. Lungs CTAB.  No wheezes, rales or ronchi. Gastrointestinal: Soft, and nondistended.  Tender to palpation in the right upper quadrant and right lower quadrant.  Negative Murphy sign.  No guarding or rebound.  No peritoneal signs. Musculoskeletal: No LE edema. No ttp in the calves or palpable cords.  Negative Homan's sign. Neurologic:  A&Ox3.  Speech is clear.  Face and smile are symmetric.  EOMI.  Moves all extremities well. Skin:  Skin is warm, dry and intact. No rash noted. Psychiatric: Mood and affect are normal. Speech and behavior are normal.  Normal judgement.  ____________________________________________   LABS (all labs ordered are listed, but only abnormal results are displayed)  Labs Reviewed  COMPREHENSIVE METABOLIC PANEL - Abnormal; Notable for the following components:      Result Value    Chloride 98 (*)    Glucose, Bld 102 (*)    Calcium 8.7 (*)    AST 61 (*)    All other components within normal limits  CBC - Abnormal; Notable for the following components:   WBC 3.6 (*)    RBC 4.24 (*)    Hemoglobin 12.5 (*)    HCT 37.5 (*)    RDW 15.8 (*)    All other components within normal limits  TROPONIN I - Abnormal; Notable for the following components:   Troponin I 0.07 (*)    All other components within normal limits  LIPASE, BLOOD  PROTIME-INR  APTT  URINALYSIS, COMPLETE (UACMP) WITH MICROSCOPIC  INFLUENZA PANEL BY PCR (TYPE A & B)   ____________________________________________  EKG  ED ECG REPORT I, Eula Listen, the attending physician, personally viewed and interpreted this ECG.   Date: 12/31/2016  EKG Time: 1302  Rate: 142  Rhythm: atrial fibrillation with RVR  Axis: leftward  Intervals:none  ST&T Change: No STEMI  ____________________________________________  RADIOLOGY  No results found.  ____________________________________________   PROCEDURES  Procedure(s) performed: None  Procedures  Critical Care  performed: Yes ____________________________________________   INITIAL IMPRESSION / ASSESSMENT AND PLAN / ED COURSE  Pertinent labs & imaging results that were available during my care of the patient were reviewed by me and considered in my medical decision making (see chart for details).  76 y.o. male with prostate cancer, presenting with several days of nausea vomiting and diarrhea associated with palpitations and presyncope.  On arrival to the emergency department, the patient is found to be in new onset atrial fibrillation with a rapid ventricular rate.  It is possible that this rate is mostly driven by dehydration, so we will plan to give him intravenous fluids and reevaluate his rate prior to administering a beta-blocker or other rate controlling medications.  The patient does have an elevated troponin, which may be due to  cardiac strain, but ACS or MI is still possible.  And aspirin has been ordered.  The patient's electrolytes are reassuring.  Influenza testing is still pending.  We will get a CT of the abdomen given the patient's discomfort on examination to evaluate for gallbladder disease appendicitis, colitis, or any other intra-abdominal pathology.  Plan reevaluation for final disposition.  Symptomatic treatment has been initiated.  ----------------------------------------- 3:11 PM on 12/31/2016 -----------------------------------------  The patient has multiple concerning findings in his imaging.  He does have a large pulmonary artery saddle embolus with multiple other PEs with evidence of heart strain.  In addition he also has multiple new enlarged metastatic masses throughout the abdomen.  The patient will be given heparin infusion without bolus, as he has recently had a significant bleeding ulcer which required intervention and transfusion.  I talked with both the patient and his son about the risks and benefits of heparin for his saddle embolus, including the risk of life-threatening bleeding, and they understand that I am trying to mitigate this risk without giving the bolus, but it is still possible as a side effect.  The patient will be admitted to the hospital for further evaluation and treatment.  During his hospitalization.  His O2 sats have been noted to be 88% on room air and he has been given supplemental oxygen with improvement to the mid-90's.  CRITICAL CARE Performed by: Eula Listen   Total critical care time: 45 minutes  Critical care time was exclusive of separately billable procedures and treating other patients.  Critical care was necessary to treat or prevent imminent or life-threatening deterioration.  Critical care was time spent personally by me on the following activities: development of treatment plan with patient and/or surrogate as well as nursing, discussions with  consultants, evaluation of patient's response to treatment, examination of patient, obtaining history from patient or surrogate, ordering and performing treatments and interventions, ordering and review of laboratory studies, ordering and review of radiographic studies, pulse oximetry and re-evaluation of patient's condition.    ____________________________________________  FINAL CLINICAL IMPRESSION(S) / ED DIAGNOSES  Final diagnoses:  NSTEMI (non-ST elevated myocardial infarction) ([place])  Atrial fibrillation with RVR ([place])  Metastatic disease ([place])  Acute saddle pulmonary embolism with acute cor pulmonale (HCC)         NEW MEDICATIONS STARTED DURING THIS VISIT:  This SmartLink is deprecated. Use AVSMEDLIST instead to display the medication list for a patient.    Eula Listen, MD 12/31/16 1513    Eula Listen, MD 12/31/16 1525

## 2016-12-31 NOTE — Progress Notes (Signed)
Spoke with Dr. Anselm Jungling on the telephone. Ordered off unit telemetry. Stated he would like patient to be placed on 1C related to met. cancer, outcome of discussion with family

## 2016-12-31 NOTE — ED Triage Notes (Addendum)
Patient c/o dizziness,SOB, nausea, and vomiting X 1 week. Reports he feels like he could pass out and has not eaten in several days. C/O Generalized pain. Reports 3-4 episodes of diarrhea in the last 24 hours. Patient currently has prostate cancer. Last chemo treatment X3 weeks ago

## 2016-12-31 NOTE — ED Notes (Signed)
Pt states not allowed to take oral asa d/t bleeding ulcer a few months ago. Spoke with dr. Mariea Clonts and changed to rectal

## 2016-12-31 NOTE — H&P (Addendum)
[facility] NAME: Daniel Estes    MR#:  [id number]  DATE OF BIRTH:  1940/08/12  DATE OF ADMISSION:  12/31/2016  PRIMARY CARE PHYSICIAN: McLean-Scocuzza, Nino Glow, MD   REQUESTING/REFERRING PHYSICIAN: Mariea Clonts  CHIEF COMPLAINT:   Chief Complaint  Patient presents with  . Dizziness  . Emesis    HISTORY OF PRESENT ILLNESS: Daniel Estes  is a 76 y.o. male with a known history of HLD, Htn, Prostate cancer with mets - followed at [facility] cancer center- last chemo was 6 weeks ago, still spreading- so was told life expectancy < 3 mths, no further plans. Was referred to hospice, but had not have appointment yet. Last 4-5 days- some nausea, weakness, SOB and dizzi. In ER- noted to have saddle Pulmonary embolism and spreading of his cancer. In May 2018- had bleeding gastric ulcer and cauterization needed. ER spoke to him about risk of bleed, he initially choose to have Anticoagulant. heparine started by ER. At baseline now, pt walks few steps in house, weak, no pain, lives with family, no mental issues.  PAST MEDICAL HISTORY:   Past Medical History:  Diagnosis Date  . Hyperlipidemia   . Hypertension   . Prostate cancer ([place])    metastasized    PAST SURGICAL HISTORY:  Past Surgical History:  Procedure Laterality Date  . APPENDECTOMY      SOCIAL HISTORY:  Social History   Tobacco Use  . Smoking status: Never Smoker  . Smokeless tobacco: Never Used  Substance Use Topics  . Alcohol use: No    Alcohol/week: 0.0 oz    Frequency: Never    Comment: occ    FAMILY HISTORY:  Family History  Problem Relation Age of Onset  . Hypertension Mother   . Hypertension Father   . Hyperlipidemia Father     DRUG ALLERGIES:  Allergies  Allergen Reactions  . No Known Allergies     REVIEW OF SYSTEMS:   CONSTITUTIONAL: No fever, positive for fatigue or weakness.  EYES: No blurred or double vision.  EARS, NOSE, AND THROAT: No tinnitus or  ear pain.  RESPIRATORY: No cough,have shortness of breath,no wheezing or hemoptysis.  CARDIOVASCULAR: No chest pain, orthopnea, edema.  GASTROINTESTINAL: No nausea, vomiting, diarrhea or abdominal pain.  GENITOURINARY: No dysuria, hematuria.  ENDOCRINE: No polyuria, nocturia,  HEMATOLOGY: No anemia, easy bruising or bleeding SKIN: No rash or lesion. MUSCULOSKELETAL: No joint pain or arthritis.   NEUROLOGIC: No tingling, numbness, weakness.  PSYCHIATRY: No anxiety or depression.   MEDICATIONS AT HOME:  Prior to Admission medications   Medication Sig Start Date End Date Taking? Authorizing Provider  amLODipine (NORVASC) 10 MG tablet Take 10 mg by mouth daily.   Yes [provider]  atenolol (TENORMIN) 100 MG tablet Take 100 mg by mouth daily.   Yes [provider]  furosemide (LASIX) 20 MG tablet Take 1 tablet by mouth daily. 11/09/16  Yes [provider]  losartan (COZAAR) 100 MG tablet Take 100 mg by mouth daily.   Yes [provider]  Multiple Vitamin (MULTIVITAMIN) capsule Take by mouth.   Yes [provider]  omeprazole (PRILOSEC) 40 MG capsule Take 1 capsule (40 mg total) by mouth 2 (two) times daily before a meal. Patient taking differently: Take 40 mg by mouth daily.  04/17/16 04/17/17 Yes Sudini, Alveta Heimlich, MD  potassium chloride (K-DUR) 10 MEQ tablet 2 (two) times daily.  12/16/15  Yes [provider]  predniSONE (DELTASONE)  10 MG tablet Take 10 mg by mouth daily with breakfast. Hold on days taking dexamethasone. 01/16/15  Yes [provider]  acetaminophen (TYLENOL) 500 MG tablet Take 500-1,000 mg by mouth every 6 (six) hours as needed.     [provider]  dexamethasone (DECADRON) 4 MG tablet Take 8 mg by mouth 2 (two) times daily. Take 8 mg by mouth twice daily before Docetaxel and twice daily the day after Docetaxel then take as directed. 03/30/16   [provider]  gabapentin (NEURONTIN) 600 MG tablet  Take 1 tablet (600 mg total) by mouth 3 (three) times daily. 10/20/16   Edrick Kins, DPM  Leuprolide Acetate, 6 Month, (LUPRON) 45 MG injection Inject 45 mg into the muscle every 6 (six) months.     [provider]  NONFORMULARY OR COMPOUNDED ITEM See pharmacy note 10/20/16   Edrick Kins, DPM  prochlorperazine (COMPAZINE) 10 MG tablet Take 10 mg by mouth every 6 (six) hours as needed for nausea/vomiting. 04/01/16   [provider]  traMADol (ULTRAM) 50 MG tablet Take 1 tablet (50 mg total) by mouth every 12 (twelve) hours as needed for severe pain. 04/17/16   Hillary Bow, MD      PHYSICAL EXAMINATION:   VITAL SIGNS: Blood pressure 124/88, pulse (!) 135, temperature 97.7 F (36.5 C), temperature source Oral, resp. rate 14, height 6\' 2"  (1.88 m), weight 113.4 kg (250 lb), SpO2 93 %.  GENERAL:  76 y.o.-year-old patient lying in the bed with no acute distress.  EYES: Pupils equal, round, reactive to light and accommodation. No scleral icterus. Extraocular muscles intact.  HEENT: Head atraumatic, normocephalic. Oropharynx and nasopharynx clear.  NECK:  Supple, no jugular venous distention. No thyroid enlargement, no tenderness.  LUNGS: Normal breath sounds bilaterally, no wheezing, rales,rhonchi or crepitation. No use of accessory muscles of respiration.  CARDIOVASCULAR: S1, S2 normal. No murmurs, rubs, or gallops.  ABDOMEN: Soft, mild tender, nondistended. Bowel sounds present. No organomegaly or mass.  EXTREMITIES: No pedal edema, cyanosis, or clubbing.  NEUROLOGIC: Cranial nerves II through XII are intact. Muscle strength 4/5 in all extremities. Sensation intact. Gait not checked.  PSYCHIATRIC: The patient is alert and oriented x 3.  SKIN: No obvious rash, lesion, or ulcer.   LABORATORY PANEL:   CBC Recent Labs  Lab 12/31/16 1305  WBC 3.6*  HGB 12.5*  HCT 37.5*  PLT 394  MCV 88.4  MCH 29.4  MCHC 33.3  RDW 15.8*    ------------------------------------------------------------------------------------------------------------------  Chemistries  Recent Labs  Lab 12/31/16 1305  NA 136  K 4.3  CL 98*  CO2 24  GLUCOSE 102*  BUN 8  CREATININE 1.00  CALCIUM 8.7*  AST 61*  ALT 18  ALKPHOS 45  BILITOT 1.2   ------------------------------------------------------------------------------------------------------------------ estimated creatinine clearance is 86.8 mL/min (by C-G formula based on SCr of 1 mg/dL). ------------------------------------------------------------------------------------------------------------------ No results for input(s): TSH, T4TOTAL, T3FREE, THYROIDAB in the last 72 hours.  Invalid input(s): FREET3   Coagulation profile Recent Labs  Lab 12/31/16 1305  INR 1.05   ------------------------------------------------------------------------------------------------------------------- No results for input(s): DDIMER in the last 72 hours. -------------------------------------------------------------------------------------------------------------------  Cardiac Enzymes Recent Labs  Lab 12/31/16 1305  TROPONINI 0.07*   ------------------------------------------------------------------------------------------------------------------ Invalid input(s): POCBNP  ---------------------------------------------------------------------------------------------------------------  Urinalysis    Component Value Date/Time   COLORURINE RED (A) 11/06/2014 1851   APPEARANCEUR CLOUDY (A) 11/06/2014 1851   APPEARANCEUR Clear 05/20/2013 0712   LABSPEC 1.015 11/06/2014 1851   LABSPEC 1.006 05/20/2013 [place]  11/06/2014 1851    TEST NOT REPORTED DUE TO COLOR INTERFERENCE OF URINE PIGMENT   GLUCOSEU (A) 11/06/2014 1851    TEST NOT REPORTED DUE TO COLOR INTERFERENCE OF URINE PIGMENT   GLUCOSEU Negative 05/20/2013 0712   HGBUR (A) 11/06/2014 1851    TEST NOT REPORTED DUE TO  COLOR INTERFERENCE OF URINE PIGMENT   BILIRUBINUR (A) 11/06/2014 1851    TEST NOT REPORTED DUE TO COLOR INTERFERENCE OF URINE PIGMENT   BILIRUBINUR Negative 05/20/2013 0712   KETONESUR (A) 11/06/2014 1851    TEST NOT REPORTED DUE TO COLOR INTERFERENCE OF URINE PIGMENT   PROTEINUR (A) 11/06/2014 1851    TEST NOT REPORTED DUE TO COLOR INTERFERENCE OF URINE PIGMENT   NITRITE (A) 11/06/2014 1851    TEST NOT REPORTED DUE TO COLOR INTERFERENCE OF URINE PIGMENT   LEUKOCYTESUR (A) 11/06/2014 1851    TEST NOT REPORTED DUE TO COLOR INTERFERENCE OF URINE PIGMENT   LEUKOCYTESUR 2+ 05/20/2013 6440     RADIOLOGY: Ct Angio Chest Pe W And/or Wo Contrast  Result Date: 12/31/2016 CLINICAL DATA:  Nausea and tachycardia for 3 days. History prostate cancer. EXAM: CT ANGIOGRAPHY CHEST CT ABDOMEN AND PELVIS WITH CONTRAST TECHNIQUE: Multidetector CT imaging of the chest was performed using the standard protocol during bolus administration of intravenous contrast. Multiplanar CT image reconstructions and MIPs were obtained to evaluate the vascular anatomy. Multidetector CT imaging of the abdomen and pelvis was performed using the standard protocol during bolus administration of intravenous contrast. CONTRAST:  100 cc Isovue 370 COMPARISON:  Multiple exams, including 05/20/2013 and 04/12/2012 FINDINGS: CTA CHEST FINDINGS Cardiovascular: Acute pulmonary embolus with saddle embolus spanning from the right to the left pulmonary artery and considerable bilateral central thrombus in major branches of the right upper lobe, right middle lobe, right lower lobe, lingula, and left lower lobe pulmonary arterial tree. Clot burden is 5. Right ventricular to left ventricular ratio 1.2 compatible with right heart strain. Enlarged main pulmonary artery. Coronary, aortic arch, and branch vessel atherosclerotic vascular disease. Mild cardiomegaly. Mediastinum/Nodes: Extensive tumor nodularity along the epicardial/pericardial space. Right  internal mammary adenopathy including a 1.3 cm node on image 39/5 somewhat confluent with the adjacent pleural tumor. A pre xiphoid node measures 1.3 cm in short axis on image 78/5. These nodes are new compared to the prior exam. Lungs/Pleura: Extensive pleural metastatic disease anteriorly in the right hemithorax and along the right hemidiaphragm. Moderate right pleural effusion is almost assuredly malignant. Musculoskeletal: No compelling findings of osseous thoracic metastatic disease. Review of the MIP images confirms the above findings. CT ABDOMEN and PELVIS FINDINGS Hepatobiliary: Tumor nodularity along the liver surface and adjacent perihepatic ascites with a thick rind of tumor along the perihepatic ascites. Tumor involvement along the falciform ligament may be invading the liver there is extensive tumor encasing portions of the gallbladder neck along the gallbladder fossa. The portal vein and hepatic vein appear patent. Pancreas: Unremarkable Spleen: The spleen appears unremarkable. Adrenals/Urinary Tract: Stable mild nodularity along the medial limb of the left adrenal gland. Left mid kidney cyst. Wall thickening in the urinary bladder. Diverticular type outpouching of the urinary bladder along the prostatectomy site eccentric to the left, similar to the prior exam. Stomach/Bowel: No dilated bowel is identified. Massive tumor deposition along pelvic loops of bowel causing a conglomerate mass of tumor in bowel. Extensive mesenteric tumor implants. Wall thickening in the rectum. Vascular/Lymphatic: Aortoiliac atherosclerotic vascular disease. A gastrohepatic ligament node measures 1.3 cm in short axis on image 34/12  and is new compared to the 2014 exam. Infrarenal abdominal aortic aneurysm up to 3.4 cm transverse. Ectatic bilateral external iliac arteries. Reproductive: Prostatectomy. Reservoir for a penile prosthesis noted in the right abdomen. Other: Malignant ascites with extensive tumor deposition.  Extensive mesenteric and omental tumor deposition. Mild presacral edema. Musculoskeletal: Lower lumbar spondylosis and degenerative disc disease. There several small sclerotic lesions favoring metastatic disease including a 1.5 cm lesion in the right ischium on image 65/15, and a 0.9 cm sclerotic lesion in the left iliac bone on image 17/15. Review of the MIP images confirms the above findings. IMPRESSION: 1. Large amount of pulmonary embolus including bilateral saddle embolus and thrombus crossing the midline from the right to the left pulmonary arteries. Positive for acute PE with CT evidence of right heart strain (RV/LV Ratio = 1.2) consistent with at least submassive (intermediate risk) PE. The presence of right heart strain has been associated with an increased risk of morbidity and mortality. Please activate Code PE by paging [phone]. 2. Marked amount of tumor rind anteriorly along the right pleural space with likely malignant right pleural effusion, as well as extensive peritoneal spread of tumor with malignant ascites and encasement of bowel loops by tumor in the pelvis. There are several small osseous metastatic lesions in the bony pelvis as well. 3. Other imaging findings of potential clinical significance: Aortic Atherosclerosis (ICD10-I70.0). Cardiomegaly and coronary atherosclerosis. 4. Nonspecific wall thickening in the rectum. Critical Value/emergent results were called by telephone at the time of interpretation on 12/31/2016 at 3:05 pm to Dr. Eula Listen , who verbally acknowledged these results. Electronically Signed   By: Van Clines M.D.   On: 12/31/2016 15:16   Ct Abdomen Pelvis W Contrast  Result Date: 12/31/2016 CLINICAL DATA:  Nausea and tachycardia for 3 days. History prostate cancer. EXAM: CT ANGIOGRAPHY CHEST CT ABDOMEN AND PELVIS WITH CONTRAST TECHNIQUE: Multidetector CT imaging of the chest was performed using the standard protocol during bolus administration of  intravenous contrast. Multiplanar CT image reconstructions and MIPs were obtained to evaluate the vascular anatomy. Multidetector CT imaging of the abdomen and pelvis was performed using the standard protocol during bolus administration of intravenous contrast. CONTRAST:  100 cc Isovue 370 COMPARISON:  Multiple exams, including 05/20/2013 and 04/12/2012 FINDINGS: CTA CHEST FINDINGS Cardiovascular: Acute pulmonary embolus with saddle embolus spanning from the right to the left pulmonary artery and considerable bilateral central thrombus in major branches of the right upper lobe, right middle lobe, right lower lobe, lingula, and left lower lobe pulmonary arterial tree. Clot burden is 5. Right ventricular to left ventricular ratio 1.2 compatible with right heart strain. Enlarged main pulmonary artery. Coronary, aortic arch, and branch vessel atherosclerotic vascular disease. Mild cardiomegaly. Mediastinum/Nodes: Extensive tumor nodularity along the epicardial/pericardial space. Right internal mammary adenopathy including a 1.3 cm node on image 39/5 somewhat confluent with the adjacent pleural tumor. A pre xiphoid node measures 1.3 cm in short axis on image 78/5. These nodes are new compared to the prior exam. Lungs/Pleura: Extensive pleural metastatic disease anteriorly in the right hemithorax and along the right hemidiaphragm. Moderate right pleural effusion is almost assuredly malignant. Musculoskeletal: No compelling findings of osseous thoracic metastatic disease. Review of the MIP images confirms the above findings. CT ABDOMEN and PELVIS FINDINGS Hepatobiliary: Tumor nodularity along the liver surface and adjacent perihepatic ascites with a thick rind of tumor along the perihepatic ascites. Tumor involvement along the falciform ligament may be invading the liver there is extensive tumor encasing portions of  the gallbladder neck along the gallbladder fossa. The portal vein and hepatic vein appear patent. Pancreas:  Unremarkable Spleen: The spleen appears unremarkable. Adrenals/Urinary Tract: Stable mild nodularity along the medial limb of the left adrenal gland. Left mid kidney cyst. Wall thickening in the urinary bladder. Diverticular type outpouching of the urinary bladder along the prostatectomy site eccentric to the left, similar to the prior exam. Stomach/Bowel: No dilated bowel is identified. Massive tumor deposition along pelvic loops of bowel causing a conglomerate mass of tumor in bowel. Extensive mesenteric tumor implants. Wall thickening in the rectum. Vascular/Lymphatic: Aortoiliac atherosclerotic vascular disease. A gastrohepatic ligament node measures 1.3 cm in short axis on image 34/12 and is new compared to the 2014 exam. Infrarenal abdominal aortic aneurysm up to 3.4 cm transverse. Ectatic bilateral external iliac arteries. Reproductive: Prostatectomy. Reservoir for a penile prosthesis noted in the right abdomen. Other: Malignant ascites with extensive tumor deposition. Extensive mesenteric and omental tumor deposition. Mild presacral edema. Musculoskeletal: Lower lumbar spondylosis and degenerative disc disease. There several small sclerotic lesions favoring metastatic disease including a 1.5 cm lesion in the right ischium on image 65/15, and a 0.9 cm sclerotic lesion in the left iliac bone on image 17/15. Review of the MIP images confirms the above findings. IMPRESSION: 1. Large amount of pulmonary embolus including bilateral saddle embolus and thrombus crossing the midline from the right to the left pulmonary arteries. Positive for acute PE with CT evidence of right heart strain (RV/LV Ratio = 1.2) consistent with at least submassive (intermediate risk) PE. The presence of right heart strain has been associated with an increased risk of morbidity and mortality. Please activate Code PE by paging [phone]. 2. Marked amount of tumor rind anteriorly along the right pleural space with likely malignant right  pleural effusion, as well as extensive peritoneal spread of tumor with malignant ascites and encasement of bowel loops by tumor in the pelvis. There are several small osseous metastatic lesions in the bony pelvis as well. 3. Other imaging findings of potential clinical significance: Aortic Atherosclerosis (ICD10-I70.0). Cardiomegaly and coronary atherosclerosis. 4. Nonspecific wall thickening in the rectum. Critical Value/emergent results were called by telephone at the time of interpretation on 12/31/2016 at 3:05 pm to Dr. Eula Listen , who verbally acknowledged these results. Electronically Signed   By: Van Clines M.D.   On: 12/31/2016 15:16    EKG: Orders placed or performed during the hospital encounter of 12/31/16  . EKG 12-Lead  . EKG 12-Lead  . ED EKG  . ED EKG    IMPRESSION AND PLAN:  * Pulmonary embolism   I discussed in detail about risk of bleed, His already limited baseline function and arranging oxygen at home. Also high chances of recurrent blood clots with cancer and his very short life expectancy.   Right now, he is already on IV heparin drip without bolus by ER. He will think about this , and let us know, if he want to cont anticoagulant or stop.   Ordered Echo. He agreed to have meeting with palliative care and hospice.  * metastatic prostate cancer   Palliative care.  * Htn   Hold some of the meds due to PE, may have low BP.  * Neuropathy    Cont gabapentin.  * new onset A fib    Will stop atenolol and start metoprolol     Echo, Cardiac monitoring.    May call cardio consult after palliative care consult.  All the records are reviewed and  case discussed with ED provider. Management plans discussed with the patient, family and they are in agreement.  CODE STATUS: DNR Code Status History    Date Active Date Inactive Code Status Order ID Comments User Context   04/16/2016 17:49 04/17/2016 15:35 Full Code [id number]  Henreitta Leber, MD Inpatient     Advance Directive Documentation     Most Recent Value  Type of Advance Directive  Out of facility DNR (pink MOST or yellow form)  Pre-existing out of facility DNR order (yellow form or pink MOST form)  No data  "MOST" Form in Place?  No data     His one son, daughter and wife present in room.  TOTAL TIME TAKING CARE OF THIS PATIENT: 50 minutes.    Vaughan Basta M.D on 12/31/2016   Between 7am to 6pm - Pager - [phone]  After 6pm go to [email or web address] - password [email or web address] [facility]  Office  [phone]  CC: Primary care physician; McLean-Scocuzza, Nino Glow, MD   Note: This dictation was prepared with Dragon dictation along with smaller phrase technology. Any transcriptional errors that result from this process are unintentional.

## 2016-12-31 NOTE — ED Notes (Signed)
Pt states he and his family decided not to go with the medication for pe's (heparin). Admitting dr made aware and heparin d/cd

## 2016-12-31 NOTE — Progress Notes (Addendum)
ANTICOAGULATION CONSULT NOTE - Initial Consult  Pharmacy Consult for heparin Indication: atrial fibrillation and multiple PE  Allergies  Allergen Reactions  . No Known Allergies     Patient Measurements: Height: 6\' 2"  (188 cm) Weight: 250 lb (113.4 kg) IBW/kg (Calculated) : 82.2 Heparin Dosing Weight: 105.9 kg  Vital Signs: Temp: 97.7 F (36.5 C) (11/29 1313) Temp Source: Oral (11/29 1313) BP: 117/80 (11/29 1445) Pulse Rate: 115 (11/29 1445)  Labs: Recent Labs    12/31/16 1305  HGB 12.5*  HCT 37.5*  PLT 394  APTT 34  LABPROT 13.6  INR 1.05  CREATININE 1.00  TROPONINI 0.07*    Estimated Creatinine Clearance: 86.8 mL/min (by C-G formula based on SCr of 1 mg/dL).   Medical History: Past Medical History:  Diagnosis Date  . Hyperlipidemia   . Hypertension   . Prostate cancer ([place])    metastasized    Medications:  Patient is not on any oral anticoagulants including aspirin at home because he had a bleeding ulcer a few months ago.   Assessment: 76 yo male presented to ED with dizziness and emesis found to be in a fib RVR. Imaging also revealed a large pulmonary artery saddle embolus with multiple other PEs and evidence of heart strain. Due to patient's recent history of ulcer bleeding, MD did not want to do heparin bolus. Initially dose based off of diagnosis of afib at rate of 1450 units/hr and at lower end of range d/t age and history of bleed. Patient was then newly diagnosed with PE but per MD did not want to increase rate to PE dose of 1650 units/hr due to patient's history of bleed. Therefore, will continue at 1450 units/hr.  Goal of Therapy:  Heparin level 0.3-0.7 units/ml Monitor platelets by anticoagulation protocol: Yes   Plan:  Start heparin infusion at 1450 units/hr Check anti-Xa level in 6 hours and daily while on heparin Continue to monitor H&H and platelets  Lendon Ka, PharmD Pharmacy Resident 12/31/2016,3:36 PM     11/29 2300 HL <0.1.  Heparin d/c by attending.  Sim Boast, PharmD, BCPS  01/01/17 12:46 AM

## 2016-12-31 NOTE — Progress Notes (Signed)
ER nurse informed me, pt have made decision -" he does nto want anticoagulant medicine. He just want to continue Oxygen." Meet with palliative care tomorrow.  heparin order is d/c ed.

## 2016-12-31 NOTE — ED Notes (Addendum)
Pt states nausea and feeling his heart racing x 3 days. Pt from triage with iv and blood drawn. Pt on monitor in the hall with family at bedside. Pt has hx of prostate ca  - stopped chemo approx 4 weeks ago.

## 2016-12-31 NOTE — Progress Notes (Signed)
Family Meeting Note  Advance Directive:yes  Today a meeting took place with the Patient, spouse and son and daughter.  The following clinical team members were present during this meeting:MD  The following were discussed:Patient's diagnosis: Metastatic prostate cancer, PE , Patient's progosis: < 6 months and Goals for treatment: DNR   I discussed in detail about risk of bleed, His already limited baseline function and arranging oxygen at home. Also high chances of recurrent blood clots with cancer and his very short life expectancy.    Additional follow-up to be provided: palliative care consult.  Time spent during discussion:30 minutes  Vaughan Basta, MD

## 2017-01-01 ENCOUNTER — Inpatient Hospital Stay: Payer: Medicare Other

## 2017-01-01 ENCOUNTER — Inpatient Hospital Stay
Admit: 2017-01-01 | Discharge: 2017-01-01 | Disposition: A | Discharge status: Home / Self Care | Payer: Medicare Other | Attending: Internal Medicine | Admitting: Internal Medicine

## 2017-01-01 DIAGNOSIS — I4891 Unspecified atrial fibrillation: Secondary | ICD-10-CM

## 2017-01-01 DIAGNOSIS — I2602 Saddle embolus of pulmonary artery with acute cor pulmonale: Secondary | ICD-10-CM

## 2017-01-01 DIAGNOSIS — I2699 Other pulmonary embolism without acute cor pulmonale: Secondary | ICD-10-CM

## 2017-01-01 DIAGNOSIS — Z515 Encounter for palliative care: Secondary | ICD-10-CM

## 2017-01-01 DIAGNOSIS — C799 Secondary malignant neoplasm of unspecified site: Secondary | ICD-10-CM

## 2017-01-01 DIAGNOSIS — I214 Non-ST elevation (NSTEMI) myocardial infarction: Secondary | ICD-10-CM

## 2017-01-01 DIAGNOSIS — R0602 Shortness of breath: Secondary | ICD-10-CM

## 2017-01-01 DIAGNOSIS — Z7189 Other specified counseling: Secondary | ICD-10-CM

## 2017-01-01 LAB — URINALYSIS, COMPLETE (UACMP) WITH MICROSCOPIC
BACTERIA UA: NONE SEEN
Bilirubin Urine: NEGATIVE
Glucose, UA: NEGATIVE mg/dL
HGB URINE DIPSTICK: NEGATIVE
Ketones, ur: 5 mg/dL — AB
NITRITE: NEGATIVE
PROTEIN: 30 mg/dL — AB
Specific Gravity, Urine: >1.046 — ABNORMAL HIGH (ref 1.005–1.030)
pH: 6 (ref 5.0–8.0)

## 2017-01-01 LAB — ECHOCARDIOGRAM COMPLETE
Height: 74 in
Weight: 3790.4 oz

## 2017-01-01 LAB — HEPARIN LEVEL (UNFRACTIONATED): Heparin Unfractionated: 0.22 IU/mL — ABNORMAL LOW (ref 0.30–0.70)

## 2017-01-01 LAB — BASIC METABOLIC PANEL
ANION GAP: 8 (ref 5–15)
BUN: 9 mg/dL (ref 6–20)
CHLORIDE: 104 mmol/L (ref 101–111)
CO2: 23 mmol/L (ref 22–32)
Calcium: 8.1 mg/dL — ABNORMAL LOW (ref 8.9–10.3)
Creatinine, Ser: 0.88 mg/dL (ref 0.61–1.24)
GFR calc Af Amer: >60 mL/min (ref >60)
GLUCOSE: 99 mg/dL (ref 65–99)
POTASSIUM: 3.9 mmol/L (ref 3.5–5.1)
Sodium: 135 mmol/L (ref 135–145)

## 2017-01-01 LAB — CBC
HEMATOCRIT: 32.8 % — AB (ref 40.0–52.0)
HEMOGLOBIN: 11.1 g/dL — AB (ref 13.0–18.0)
MCH: 29.9 pg (ref 26.0–34.0)
MCHC: 33.7 g/dL (ref 32.0–36.0)
MCV: 88.6 fL (ref 80.0–100.0)
Platelets: 363 10*3/uL (ref 150–440)
RBC: 3.7 MIL/uL — ABNORMAL LOW (ref 4.40–5.90)
RDW: 15.9 % — ABNORMAL HIGH (ref 11.5–14.5)
WBC: 3.7 10*3/uL — ABNORMAL LOW (ref 3.8–10.6)

## 2017-01-01 MED ORDER — TRAMADOL HCL 50 MG PO TABS
50.0000 mg | ORAL_TABLET | Freq: Two times a day (BID) | ORAL | Status: DC | PRN
Start: 2017-01-01 — End: 2017-01-01
  Administered 2017-01-01: 50 mg via ORAL
  Filled 2017-01-01: qty 1

## 2017-01-01 MED ORDER — TRAMADOL HCL 50 MG PO TABS
50.0000 mg | ORAL_TABLET | Freq: Four times a day (QID) | ORAL | Status: DC | PRN
  Administered 2017-01-01: 50 mg via ORAL
  Filled 2017-01-01: qty 1

## 2017-01-01 MED ORDER — PANTOPRAZOLE SODIUM 40 MG PO TBEC
40.0000 mg | DELAYED_RELEASE_TABLET | Freq: Two times a day (BID) | ORAL | Status: DC
Start: 2017-01-01 — End: 2017-01-02
  Administered 2017-01-01 – 2017-01-02 (×2): 40 mg via ORAL
  Filled 2017-01-01 (×2): qty 1

## 2017-01-01 MED ORDER — SENNOSIDES-DOCUSATE SODIUM 8.6-50 MG PO TABS
1.0000 | ORAL_TABLET | Freq: Every day | ORAL | Status: DC
  Administered 2017-01-01 – 2017-01-02 (×2): 1 via ORAL
  Filled 2017-01-01 (×2): qty 1

## 2017-01-01 MED ORDER — HEPARIN BOLUS VIA INFUSION
800.0000 [IU] | Freq: Once | INTRAVENOUS | Status: AC
  Administered 2017-01-01: 800 [IU] via INTRAVENOUS
  Filled 2017-01-01: qty 800

## 2017-01-01 MED ORDER — HEPARIN (PORCINE) IN NACL 100-0.45 UNIT/ML-% IJ SOLN: 1400.0000 [IU]/h | INTRAMUSCULAR | Status: DC

## 2017-01-01 MED ORDER — ORAL CARE MOUTH RINSE
15.0000 mL | Freq: Two times a day (BID) | OROMUCOSAL | Status: DC
  Administered 2017-01-01 – 2017-01-02 (×2): 15 mL via OROMUCOSAL

## 2017-01-01 MED ORDER — HEPARIN BOLUS VIA INFUSION
4000.0000 [IU] | Freq: Once | INTRAVENOUS | Status: DC
  Filled 2017-01-01: qty 4000

## 2017-01-01 MED ORDER — DOCUSATE SODIUM 100 MG PO CAPS
100.0000 mg | ORAL_CAPSULE | Freq: Every day | ORAL | Status: DC
  Administered 2017-01-01 – 2017-01-02 (×2): 100 mg via ORAL
  Filled 2017-01-01 (×2): qty 1

## 2017-01-01 MED ORDER — HEPARIN (PORCINE) IN NACL 100-0.45 UNIT/ML-% IJ SOLN
1650.0000 [IU]/h | INTRAMUSCULAR | Status: DC
  Administered 2017-01-01: 10:00:00 1450 [IU]/h via INTRAVENOUS
  Administered 2017-01-02: 1650 [IU]/h via INTRAVENOUS
  Filled 2017-01-01 (×2): qty 250

## 2017-01-01 NOTE — Progress Notes (Signed)
*  PRELIMINARY RESULTS* Echocardiogram 2D Echocardiogram has been performed.  Sherrie Sport 01/01/2017, 9:51 AM

## 2017-01-01 NOTE — Progress Notes (Signed)
Contacted Dr. Dennard Nip due to pain rated 8 on scale of 1-10. Prescribed pain medication exhausted. DR gave verbal order for tramadol 50mg  q 6 hrs prn. Pt pain goal is a 3 on 1-10 pain scale. Will continue to monitor pt. Pain status.

## 2017-01-01 NOTE — Progress Notes (Signed)
Initial Nutrition Assessment  DOCUMENTATION CODES:   Obesity unspecified  INTERVENTION:  1. Ensure Enlive po BID, each supplement provides 350 kcal and 20 grams of protein  NUTRITION DIAGNOSIS:   Inadequate oral intake related to poor appetite, acute illness as evidenced by per patient/family report.  GOAL:   Patient will meet greater than or equal to 90% of their needs  MONITOR:   PO intake, I & O's, Labs, Supplement acceptance, Weight trends  REASON FOR ASSESSMENT:   Malnutrition Screening Tool    ASSESSMENT:   76 year old male who was a Jehovah's witness (no longer) with a history of metastatic prostate cancer recently referred to hospice who presents with shortness of breath.   Spoke with patient's daughter at bedside. Patient was in ultrasound during visit. She states during the past 2 weeks patient has been eating poorly, mostly picking at his food. Only really eating soups and broths. Reports that he has lost a lot of weight recently but she is unsure how much. Per chart exhibits a 34 pound/13% severe weight loss over 7 months. Reports he normally makes "green drinks" for breakfast with fruits and vegetables, eats sandwiches for lunch and eats whatever his wife cooks for dinner.  Meal Completion: 70%  Labs reviewed  Medications reviewed and include:  MVI w/ minerals Prednisone Senokot-S    NUTRITION - FOCUSED PHYSICAL EXAM:  Unable to complete at this time.  Diet Order:  Diet Heart Room service appropriate? Yes; Fluid consistency: Thin  EDUCATION NEEDS:   Not appropriate for education at this time  Skin:  Skin Assessment: Reviewed RN Assessment  Last BM:  12/30/2016  Height:   Ht Readings from Last 1 Encounters:  12/31/16 6\' 2"  (1.88 m)    Weight:   Wt Readings from Last 1 Encounters:  12/31/16 236 lb 14.4 oz (107.5 kg)    Ideal Body Weight:  86.36 kg  BMI:  Body mass index is 30.42 kg/m.  Estimated Nutritional Needs:   Kcal:   2300-2600 calories  Protein:  130-150 grams  Fluid:  2.3-2.6L  Satira Anis. Babyboy Loya, MS, RD LDN Inpatient Clinical Dietitian Pager [phone]

## 2017-01-01 NOTE — Progress Notes (Signed)
New referral for [facility] services at home received from [facility]. Patient is currently receiving continuous heparin for a large bilateral pulmonary emboli. Patient information faxed to referral.  No discharge planned over the weekend. Writer to follow up with patient and family on Monday. [place] aware. Please contact [phone] if patient does discharge prior to Monday. Thank you. Flo Shanks RN, BSN, [place] and [facility] Liaison

## 2017-01-01 NOTE — Progress Notes (Signed)
ANTICOAGULATION CONSULT NOTE - Initial Consult  Pharmacy Consult for heparin Indication: atrial fibrillation and multiple PE  Allergies  Allergen Reactions  . No Known Allergies     Patient Measurements: Height: 6\' 2"  (188 cm) Weight: 236 lb 14.4 oz (107.5 kg) IBW/kg (Calculated) : 82.2 Heparin Dosing Weight: 105.9 kg  Vital Signs: Temp: 98.4 F (36.9 C) (11/30 0407) Temp Source: Oral (11/30 0407) BP: 128/88 (11/30 0823) Pulse Rate: 108 (11/30 0823)  Labs: Recent Labs    12/31/16 1305 12/31/16 2254 01/01/17 0406  HGB 12.5*  --  11.1*  HCT 37.5*  --  32.8*  PLT 394  --  363  APTT 34  --   --   LABPROT 13.6  --   --   INR 1.05  --   --   HEPARINUNFRC  --  <0.10*  --   CREATININE 1.00  --  0.88  TROPONINI 0.07*  --   --     Estimated Creatinine Clearance: 96.1 mL/min (by C-G formula based on SCr of 0.88 mg/dL).   Medical History: Past Medical History:  Diagnosis Date  . Hyperlipidemia   . Hypertension   . Prostate cancer ([place])    metastasized    Medications:  Patient is not on any oral anticoagulants including aspirin at home because he had a bleeding ulcer a few months ago.   Assessment: 76 yo male presented to ED with dizziness and emesis found to be in a fib RVR and Imaging also revealed a large pulmonary artery saddle embolus with multiple other PEs and evidence of heart strain.  Heparin drip was originally started on 11/29, but discontinued due to patient preference. Patient has recent history of ulcer bleeding.  11/30 Patient agreed to restart heparin drip. Pharmacy consulted for dosing and monitoring.   Goal of Therapy:  Heparin level 0.3-0.7 units/ml Monitor platelets by anticoagulation protocol: Yes   Plan:  Due to recent hx of GI bleed, will not bolus.  Start heparin infusion at 1450 units/hr Check anti-Xa level in 6 hours and daily while on heparin Continue to monitor H&H and platelets  Pernell Dupre, PharmD, BCPS Clinical  Pharmacist 01/01/2017 9:53 AM

## 2017-01-01 NOTE — Consult Note (Signed)
Consultation Note Date: 01/01/2017   Patient Name: Daniel Estes  DOB: [birth year]  MRN: [id number]  Age / Sex: 76 y.o., male  PCP: McLean-Scocuzza, Nino Glow, MD Referring Physician: Bettey Costa, MD  Reason for Consultation: Mets, cancer, pulm emboli, recent GI bleed.  HPI/Patient Profile:  Daniel Estes  is a 76 y.o. male with a known history of HLD, Htn, Prostate cancer with mets - followed at [facility]- last chemo was 6 weeks ago, still spreading- so was told life expectancy < 3 mths, no further plans. Was referred to hospice, but had not have appointment yet.    Clinical Assessment and Goals of Care: Daniel Estes has been followed by [facility] for metastatic cancer which continued to spread. He states he had made the decision for hospice treatment, but had not selected a hospice group yet. He presented to the ED because of symptoms related to an embolism, and was admitted. We discussed aggressive care vs hospice care, and he states he would like to have the embolism treated, and is willing to receive blood if needed. He is willing to have intubation and mechanical ventilation if he develops respiratory distress. He does not want chest compressions, shocks, or intubation if he is found pulseless.  He states that after discharge, he would like to transition to hospice care, and stay at home.   He is a retired Facilities manager. He has been married 83 years and has 3 children.   Patient is Media planner.     SUMMARY OF RECOMMENDATIONS   Treatment of embolism. He is willing to have intubation and ventilator while here for the embolism if it is needed. Comfort care at home.    Code Status/Advance Care Planning:  DNR    Symptom Management:   Pain is controlled with current regimen.  Palliative Prophylaxis:   Bowel Regimen and Oral Care    Prognosis:   < 6 months  Discharge Planning:  Home with Hospice      Primary Diagnoses: Present on Admission: **None**   I have reviewed the medical record, interviewed the patient and family, and examined the patient. The following aspects are pertinent.  Past Medical History:  Diagnosis Date  . Hyperlipidemia   . Hypertension   . Prostate cancer [facility])    metastasized   Social History   Socioeconomic History  . Marital status: Married    Spouse name: None  . Number of children: None  . Years of education: None  . Highest education level: None  Social Needs  . Financial resource strain: None  . Food insecurity - worry: None  . Food insecurity - inability: None  . Transportation needs - medical: None  . Transportation needs - non-medical: None  Occupational History  . None  Tobacco Use  . Smoking status: Never Smoker  . Smokeless tobacco: Never Used  Substance and Sexual Activity  . Alcohol use: No    Alcohol/week: 0.0 oz    Frequency: Never    Comment: occ  . Drug  use: No  . Sexual activity: Not Currently  Other Topics Concern  . None  Social History Narrative  . None   Family History  Problem Relation Age of Onset  . Hypertension Mother   . Hypertension Father   . Hyperlipidemia Father    Scheduled Meds: . docusate sodium  100 mg Oral Daily  . gabapentin  600 mg Oral TID  . mouth rinse  15 mL Mouth Rinse BID  . metoprolol tartrate  25 mg Oral BID  . multivitamin with minerals  1 tablet Oral Daily  . pantoprazole  40 mg Oral BID  . predniSONE  10 mg Oral Q breakfast  . senna-docusate  1 tablet Oral Daily   Continuous Infusions: . heparin 1,450 Units/hr (01/01/17 1027)   PRN Meds:.acetaminophen, prochlorperazine, traMADol Medications Prior to Admission:  Prior to Admission medications   Medication Sig Start Date End Date Taking? Authorizing Provider  amLODipine (NORVASC) 10 MG tablet Take 10 mg by mouth daily.   Yes [provider]  atenolol (TENORMIN) 100 MG tablet Take 100 mg by  mouth daily.   Yes [provider]  furosemide (LASIX) 20 MG tablet Take 1 tablet by mouth daily. 11/09/16  Yes [provider]  losartan (COZAAR) 100 MG tablet Take 100 mg by mouth daily.   Yes [provider]  Multiple Vitamin (MULTIVITAMIN) capsule Take by mouth.   Yes [provider]  omeprazole (PRILOSEC) 40 MG capsule Take 1 capsule (40 mg total) by mouth 2 (two) times daily before a meal. Patient taking differently: Take 40 mg by mouth daily.  04/17/16 04/17/17 Yes Sudini, Alveta Heimlich, MD  potassium chloride (K-DUR) 10 MEQ tablet 2 (two) times daily.  12/16/15  Yes [provider]  predniSONE (DELTASONE) 10 MG tablet Take 10 mg by mouth daily with breakfast. Hold on days taking dexamethasone. 01/16/15  Yes [provider]  acetaminophen (TYLENOL) 500 MG tablet Take 500-1,000 mg by mouth every 6 (six) hours as needed.     [provider]  dexamethasone (DECADRON) 4 MG tablet Take 8 mg by mouth 2 (two) times daily. Take 8 mg by mouth twice daily before Docetaxel and twice daily the day after Docetaxel then take as directed. 03/30/16   [provider]  gabapentin (NEURONTIN) 600 MG tablet Take 1 tablet (600 mg total) by mouth 3 (three) times daily. 10/20/16   Edrick Kins, DPM  Leuprolide Acetate, 6 Month, (LUPRON) 45 MG injection Inject 45 mg into the muscle every 6 (six) months.     [provider]  NONFORMULARY OR COMPOUNDED ITEM See pharmacy note 10/20/16   Edrick Kins, DPM  prochlorperazine (COMPAZINE) 10 MG tablet Take 10 mg by mouth every 6 (six) hours as needed for nausea/vomiting. 04/01/16   [provider]  traMADol (ULTRAM) 50 MG tablet Take 1 tablet (50 mg total) by mouth every 12 (twelve) hours as needed for severe pain. 04/17/16   Hillary Bow, MD   Allergies  Allergen Reactions  . No Known Allergies    Review of Systems  Physical Exam  Vital Signs: BP 128/88   Pulse (!) 108   Temp 98.4 F  (36.9 C) (Oral)   Resp 18   Ht 6\' 2"  (1.88 m)   Wt 107.5 kg (236 lb 14.4 oz)   SpO2 97%   BMI 30.42 kg/m  Pain Assessment: 0-10   Pain Score: 0-No pain   SpO2: SpO2: 97 % O2 Device:SpO2: 97 % O2 Flow Rate: .  O2 Flow Rate (L/min): 2 L/min  IO: Intake/output summary:   Intake/Output Summary (Last 24 hours) at 01/01/2017 1058 Last data filed at 01/01/2017 0900 Gross per 24 hour  Intake 1240 ml  Output 200 ml  Net 1040 ml    LBM: Last BM Date: 12/30/16(patient states feels constipated ) Baseline Weight: Weight: 113.4 kg (250 lb) Most recent weight: Weight: 107.5 kg (236 lb 14.4 oz)     Palliative Assessment/Data:      Time In: 10:20 Time Out: 11:10 Time Total: 50 min Greater than 50%  of this time was spent counseling and coordinating care related to the above assessment and plan.  Signed by: Asencion Gowda, NP 01/01/2017 12:06 PM Office: [phone] 7am-7pm  Please see Amion for pager number and availability  Call primary team after hours  Please contact Palliative Medicine Team phone at [phone] for questions and concerns.  For individual provider: See Shea Evans

## 2017-01-01 NOTE — Care Management Note (Signed)
Case Management Note  Patient Details  Name: Daniel Estes MRN: [id number] Date of Birth: 12/03/1940  Subjective/Objective:  Admitted to [facility] with the diagnosis of pulmonary embolism.  Lives with wife, Joycelyn Schmid [phone]. Dr. Jacklynn Lewis is listed as primary care physician. Sees Dr, Iona Beard ar [place]. Prescriptions are filled at [facility]. No home Health. No skilled facility.No home oxygen. Rolling walker, cane, and raised toilet seat in the home. Self feeds, needs help with baths and dressing. Wife helps. No falls. Decreased appetite.                 Action/Plan: Received referral for possible Lovenox in the home. [name] has traditional Medicare as insurance. Should be $50.00 for Lovenox. Wife wants Hospice in the home. Daughter states that her mother wants [facility]. Flo Shanks, RN representative updated.    Expected Discharge Date:                  Expected Discharge Plan:     In-House Referral:   yes  Discharge planning Services     Post Acute Care Choice:   yes Choice offered to:   family  DME Arranged:    DME Agency:     HH Arranged:   yes HH Agency:   [facility]  Status of Service:     If discussed at [employer] of Stay Meetings, dates discussed:    Additional Comments:  Shelbie Ammons, RN MSN CCM Care Management [phone] 01/01/2017, 11:42 AM

## 2017-01-01 NOTE — Plan of Care (Signed)
  Education: Knowledge of General Education information will improve 01/01/2017 0319 - Progressing by Jeffie Pollock, RN   Clinical Measurements: Ability to maintain clinical measurements within normal limits will improve 01/01/2017 0319 - Progressing by Jeffie Pollock, RN Will remain free from infection 01/01/2017 0319 - Progressing by Jeffie Pollock, RN Diagnostic test results will improve 01/01/2017 0319 - Progressing by Jeffie Pollock, RN Respiratory complications will improve 01/01/2017 0319 - Progressing by Jeffie Pollock, RN Cardiovascular complication will be avoided 01/01/2017 0319 - Progressing by Jeffie Pollock, RN   Nutrition: Adequate nutrition will be maintained 01/01/2017 0319 - Progressing by Jeffie Pollock, RN

## 2017-01-01 NOTE — Progress Notes (Signed)
[facility] [place] NAME: Daniel Estes    MR#:  [id number]  DATE OF BIRTH:  [birth year]  SUBJECTIVE:   Patient has hx of GIB was worried about anticoagulation  REVIEW OF SYSTEMS:    Review of Systems  Constitutional: Negative for fever, chills weight loss HENT: Negative for ear pain, nosebleeds, congestion, facial swelling, rhinorrhea, neck pain, neck stiffness and ear discharge.   Respiratory: Negative for cough, ++shortness of breath, NO wheezing  Cardiovascular: Negative for chest pain, palpitations and leg swelling.  Gastrointestinal: Negative for heartburn, abdominal pain, vomiting, diarrhea or consitpation Genitourinary: Negative for dysuria, urgency, frequency, hematuria Musculoskeletal: Negative for back pain or joint pain Neurological: Negative for dizziness, seizures, syncope, focal weakness,  numbness and headaches.  Hematological: Does not bruise/bleed easily.  Psychiatric/Behavioral: Negative for hallucinations, confusion, dysphoric mood    Tolerating Diet: yes      DRUG ALLERGIES:   Allergies  Allergen Reactions  . No Known Allergies     VITALS:  Blood pressure 128/88, pulse (!) 108, temperature 98.4 F (36.9 C), temperature source Oral, resp. rate 18, height 6\' 2"  (1.88 m), weight 107.5 kg (236 lb 14.4 oz), SpO2 97 %.  PHYSICAL EXAMINATION:  Constitutional: Appears well-developed and well-nourished. No distress. HENT: Normocephalic. Marland Kitchen Oropharynx is clear and moist.  Eyes: Conjunctivae and EOM are normal. PERRLA, no scleral icterus.  Neck: Normal ROM. Neck supple. No JVD. No tracheal deviation. CVS: RRR, S1/S2 +, no murmurs, no gallops, no carotid bruit.  Pulmonary: Effort and breath sounds normal, no stridor, rhonchi, wheezes, rales.  Abdominal: Soft. BS +,  no distension, tenderness, rebound or guarding.  Musculoskeletal: Normal range of motion. No edema and no tenderness.  Neuro: Alert. CN 2-12 grossly  intact. No focal deficits. Skin: Skin is warm and dry. No rash noted. Psychiatric: Normal mood and affect.      LABORATORY PANEL:   CBC Recent Labs  Lab 01/01/17 0406  WBC 3.7*  HGB 11.1*  HCT 32.8*  PLT 363   ------------------------------------------------------------------------------------------------------------------  Chemistries  Recent Labs  Lab 12/31/16 1305 01/01/17 0406  NA 136 135  K 4.3 3.9  CL 98* 104  CO2 24 23  GLUCOSE 102* 99  BUN 8 9  CREATININE 1.00 0.88  CALCIUM 8.7* 8.1*  AST 61*  --   ALT 18  --   ALKPHOS 45  --   BILITOT 1.2  --    ------------------------------------------------------------------------------------------------------------------  Cardiac Enzymes Recent Labs  Lab 12/31/16 1305  TROPONINI 0.07*   ------------------------------------------------------------------------------------------------------------------  RADIOLOGY:  Ct Angio Chest Pe W And/or Wo Contrast  Result Date: 12/31/2016 CLINICAL DATA:  Nausea and tachycardia for 3 days. History prostate cancer. EXAM: CT ANGIOGRAPHY CHEST CT ABDOMEN AND PELVIS WITH CONTRAST TECHNIQUE: Multidetector CT imaging of the chest was performed using the standard protocol during bolus administration of intravenous contrast. Multiplanar CT image reconstructions and MIPs were obtained to evaluate the vascular anatomy. Multidetector CT imaging of the abdomen and pelvis was performed using the standard protocol during bolus administration of intravenous contrast. CONTRAST:  100 cc Isovue 370 COMPARISON:  Multiple exams, including 05/20/2013 and 04/12/2012 FINDINGS: CTA CHEST FINDINGS Cardiovascular: Acute pulmonary embolus with saddle embolus spanning from the right to the left pulmonary artery and considerable bilateral central thrombus in major branches of the right upper lobe, right middle lobe, right lower lobe, lingula, and left lower lobe pulmonary arterial tree. Clot burden is 5. Right  ventricular to left ventricular ratio 1.2 compatible  with right heart strain. Enlarged main pulmonary artery. Coronary, aortic arch, and branch vessel atherosclerotic vascular disease. Mild cardiomegaly. Mediastinum/Nodes: Extensive tumor nodularity along the epicardial/pericardial space. Right internal mammary adenopathy including a 1.3 cm node on image 39/5 somewhat confluent with the adjacent pleural tumor. A pre xiphoid node measures 1.3 cm in short axis on image 78/5. These nodes are new compared to the prior exam. Lungs/Pleura: Extensive pleural metastatic disease anteriorly in the right hemithorax and along the right hemidiaphragm. Moderate right pleural effusion is almost assuredly malignant. Musculoskeletal: No compelling findings of osseous thoracic metastatic disease. Review of the MIP images confirms the above findings. CT ABDOMEN and PELVIS FINDINGS Hepatobiliary: Tumor nodularity along the liver surface and adjacent perihepatic ascites with a thick rind of tumor along the perihepatic ascites. Tumor involvement along the falciform ligament may be invading the liver there is extensive tumor encasing portions of the gallbladder neck along the gallbladder fossa. The portal vein and hepatic vein appear patent. Pancreas: Unremarkable Spleen: The spleen appears unremarkable. Adrenals/Urinary Tract: Stable mild nodularity along the medial limb of the left adrenal gland. Left mid kidney cyst. Wall thickening in the urinary bladder. Diverticular type outpouching of the urinary bladder along the prostatectomy site eccentric to the left, similar to the prior exam. Stomach/Bowel: No dilated bowel is identified. Massive tumor deposition along pelvic loops of bowel causing a conglomerate mass of tumor in bowel. Extensive mesenteric tumor implants. Wall thickening in the rectum. Vascular/Lymphatic: Aortoiliac atherosclerotic vascular disease. A gastrohepatic ligament node measures 1.3 cm in short axis on image 34/12  and is new compared to the 2014 exam. Infrarenal abdominal aortic aneurysm up to 3.4 cm transverse. Ectatic bilateral external iliac arteries. Reproductive: Prostatectomy. Reservoir for a penile prosthesis noted in the right abdomen. Other: Malignant ascites with extensive tumor deposition. Extensive mesenteric and omental tumor deposition. Mild presacral edema. Musculoskeletal: Lower lumbar spondylosis and degenerative disc disease. There several small sclerotic lesions favoring metastatic disease including a 1.5 cm lesion in the right ischium on image 65/15, and a 0.9 cm sclerotic lesion in the left iliac bone on image 17/15. Review of the MIP images confirms the above findings. IMPRESSION: 1. Large amount of pulmonary embolus including bilateral saddle embolus and thrombus crossing the midline from the right to the left pulmonary arteries. Positive for acute PE with CT evidence of right heart strain (RV/LV Ratio = 1.2) consistent with at least submassive (intermediate risk) PE. The presence of right heart strain has been associated with an increased risk of morbidity and mortality. Please activate Code PE by paging [phone]. 2. Marked amount of tumor rind anteriorly along the right pleural space with likely malignant right pleural effusion, as well as extensive peritoneal spread of tumor with malignant ascites and encasement of bowel loops by tumor in the pelvis. There are several small osseous metastatic lesions in the bony pelvis as well. 3. Other imaging findings of potential clinical significance: Aortic Atherosclerosis (ICD10-I70.0). Cardiomegaly and coronary atherosclerosis. 4. Nonspecific wall thickening in the rectum. Critical Value/emergent results were called by telephone at the time of interpretation on 12/31/2016 at 3:05 pm to Dr. Eula Listen , who verbally acknowledged these results. Electronically Signed   By: Van Clines M.D.   On: 12/31/2016 15:16   Ct Abdomen Pelvis W  Contrast  Result Date: 12/31/2016 CLINICAL DATA:  Nausea and tachycardia for 3 days. History prostate cancer. EXAM: CT ANGIOGRAPHY CHEST CT ABDOMEN AND PELVIS WITH CONTRAST TECHNIQUE: Multidetector CT imaging of the chest was performed using  the standard protocol during bolus administration of intravenous contrast. Multiplanar CT image reconstructions and MIPs were obtained to evaluate the vascular anatomy. Multidetector CT imaging of the abdomen and pelvis was performed using the standard protocol during bolus administration of intravenous contrast. CONTRAST:  100 cc Isovue 370 COMPARISON:  Multiple exams, including 05/20/2013 and 04/12/2012 FINDINGS: CTA CHEST FINDINGS Cardiovascular: Acute pulmonary embolus with saddle embolus spanning from the right to the left pulmonary artery and considerable bilateral central thrombus in major branches of the right upper lobe, right middle lobe, right lower lobe, lingula, and left lower lobe pulmonary arterial tree. Clot burden is 5. Right ventricular to left ventricular ratio 1.2 compatible with right heart strain. Enlarged main pulmonary artery. Coronary, aortic arch, and branch vessel atherosclerotic vascular disease. Mild cardiomegaly. Mediastinum/Nodes: Extensive tumor nodularity along the epicardial/pericardial space. Right internal mammary adenopathy including a 1.3 cm node on image 39/5 somewhat confluent with the adjacent pleural tumor. A pre xiphoid node measures 1.3 cm in short axis on image 78/5. These nodes are new compared to the prior exam. Lungs/Pleura: Extensive pleural metastatic disease anteriorly in the right hemithorax and along the right hemidiaphragm. Moderate right pleural effusion is almost assuredly malignant. Musculoskeletal: No compelling findings of osseous thoracic metastatic disease. Review of the MIP images confirms the above findings. CT ABDOMEN and PELVIS FINDINGS Hepatobiliary: Tumor nodularity along the liver surface and adjacent  perihepatic ascites with a thick rind of tumor along the perihepatic ascites. Tumor involvement along the falciform ligament may be invading the liver there is extensive tumor encasing portions of the gallbladder neck along the gallbladder fossa. The portal vein and hepatic vein appear patent. Pancreas: Unremarkable Spleen: The spleen appears unremarkable. Adrenals/Urinary Tract: Stable mild nodularity along the medial limb of the left adrenal gland. Left mid kidney cyst. Wall thickening in the urinary bladder. Diverticular type outpouching of the urinary bladder along the prostatectomy site eccentric to the left, similar to the prior exam. Stomach/Bowel: No dilated bowel is identified. Massive tumor deposition along pelvic loops of bowel causing a conglomerate mass of tumor in bowel. Extensive mesenteric tumor implants. Wall thickening in the rectum. Vascular/Lymphatic: Aortoiliac atherosclerotic vascular disease. A gastrohepatic ligament node measures 1.3 cm in short axis on image 34/12 and is new compared to the 2014 exam. Infrarenal abdominal aortic aneurysm up to 3.4 cm transverse. Ectatic bilateral external iliac arteries. Reproductive: Prostatectomy. Reservoir for a penile prosthesis noted in the right abdomen. Other: Malignant ascites with extensive tumor deposition. Extensive mesenteric and omental tumor deposition. Mild presacral edema. Musculoskeletal: Lower lumbar spondylosis and degenerative disc disease. There several small sclerotic lesions favoring metastatic disease including a 1.5 cm lesion in the right ischium on image 65/15, and a 0.9 cm sclerotic lesion in the left iliac bone on image 17/15. Review of the MIP images confirms the above findings. IMPRESSION: 1. Large amount of pulmonary embolus including bilateral saddle embolus and thrombus crossing the midline from the right to the left pulmonary arteries. Positive for acute PE with CT evidence of right heart strain (RV/LV Ratio = 1.2)  consistent with at least submassive (intermediate risk) PE. The presence of right heart strain has been associated with an increased risk of morbidity and mortality. Please activate Code PE by paging [phone]. 2. Marked amount of tumor rind anteriorly along the right pleural space with likely malignant right pleural effusion, as well as extensive peritoneal spread of tumor with malignant ascites and encasement of bowel loops by tumor in the pelvis. There are several small  osseous metastatic lesions in the bony pelvis as well. 3. Other imaging findings of potential clinical significance: Aortic Atherosclerosis (ICD10-I70.0). Cardiomegaly and coronary atherosclerosis. 4. Nonspecific wall thickening in the rectum. Critical Value/emergent results were called by telephone at the time of interpretation on 12/31/2016 at 3:05 pm to Dr. Eula Listen , who verbally acknowledged these results. Electronically Signed   By: Van Clines M.D.   On: 12/31/2016 15:16     ASSESSMENT AND PLAN:   76 year old male who was a Jehovah's witness (no longer) with a history of metastatic prostate cancer recently referred to hospice who presents with shortness of breath.  1. Large bilateral saddle pulmonary emboli with right heart strain: I had a long discussion with the patient and family at bedside. Side effects, alternatives, risks and benefits were discussed in great detail about anticoagulation. Patient was advised that if he did not take blood thinners he could potentially die at any point. He does have a history of lower GI bleed and clips were placed for polyps found in the cecum. He accepts these risks and wants to start anticoagulation.  Start heparin drip Vascular surgery consultation for possible thrombectomy Lower extremity Doppler ordered Follow up on echocardiogram   2. Metastatic prostate cancer: Patient has been referred to hospice by his oncologist at [facility]. He does want a follow-up with an  oncologist here. Consultation will be placed.  3. Essential hypertension: Continue metoprolol  Management plans discussed with the patient and family and they are in agreement.  CODE STATUS: DNR  TOTAL TIME TAKING CARE OF THIS PATIENT: 30 minutes.  Palliative care consultation requested as well.   POSSIBLE D/C 2-3 days, DEPENDING ON CLINICAL CONDITION.   Sivan Quast M.D on 01/01/2017 at 9:36 AM  Between 7am to 6pm - Pager - [phone] After 6pm go to [email or web address] - password [email or web address]  Office  [phone]  CC: Primary care physician; McLean-Scocuzza, Nino Glow, MD  Note: This dictation was prepared with Dragon dictation along with smaller phrase technology. Any transcriptional errors that result from this process are unintentional.

## 2017-01-01 NOTE — Consult Note (Signed)
[facility] Vascular Consult Note  MRN : [id number]  Daniel Estes is a 76 y.o. ([birth year]) male who presents with chief complaint of  Chief Complaint  Patient presents with  . Dizziness  . Emesis  .  History of Present Illness: I am asked to evaluate the patient by Dr. Genia Harold for newly diagnosed pulmonary emboli.  Patient is a 76 year old gentleman with multiple medical problems who was seen most recently in my office approximately 6 months ago for bilateral lower extremity lymphedema and venous insufficiency.  He presented to [facility] emergency room yesterday with increasing shortness of breath and dizziness.  This is been fairly abrupt in onset over the past 4-5 days.  It was associated with nausea and weakness.  Workup in the emergency room demonstrated saddle pulmonary emboli by CT angiography.  He has been admitted and initiated on a heparin drip.  Initially he was reluctant to allow anticoagulation as he has had some GI bleeding in the past but under the circumstances he has agreed to heparinization.  Of significance the patient was recently seen at the [facility] cancer center he is being treated for metastatic prostate cancer and was informed his last visit that in spite of chemotherapy his tumor burden is continuing to grow and that he has a very short life expectancy.  He was referred to hospice but has not had his initial evaluation.  Current Facility-Administered Medications  Medication Dose Route Frequency Provider Last Rate Last Dose  . acetaminophen (TYLENOL) tablet 500-1,000 mg  500-1,000 mg Oral Q6H PRN Vaughan Basta, MD      . docusate sodium (COLACE) capsule 100 mg  100 mg Oral Daily Bettey Costa, MD   100 mg at 01/01/17 0820  . gabapentin (NEURONTIN) tablet 600 mg  600 mg Oral TID Vaughan Basta, MD   600 mg at 01/01/17 1528  . heparin ADULT infusion 100 units/mL (25000 units/292mL sodium chloride 0.45%)  1,450  Units/hr Intravenous Continuous Hallaji, Sheema M, RPH 14.5 mL/hr at 01/01/17 1027 1,450 Units/hr at 01/01/17 1027  . MEDLINE mouth rinse  15 mL Mouth Rinse BID Vaughan Basta, MD   15 mL at 01/01/17 0819  . metoprolol tartrate (LOPRESSOR) tablet 25 mg  25 mg Oral BID Vaughan Basta, MD   25 mg at 01/01/17 9741  . multivitamin with minerals tablet 1 tablet  1 tablet Oral Daily Vaughan Basta, MD   1 tablet at 01/01/17 0819  . pantoprazole (PROTONIX) EC tablet 40 mg  40 mg Oral BID Mody, Sital, MD      . predniSONE (DELTASONE) tablet 10 mg  10 mg Oral Q breakfast Vaughan Basta, MD   10 mg at 01/01/17 1326  . prochlorperazine (COMPAZINE) tablet 10 mg  10 mg Oral Q6H PRN Vaughan Basta, MD   10 mg at 12/31/16 2019  . senna-docusate (Senokot-S) tablet 1 tablet  1 tablet Oral Daily Bettey Costa, MD   1 tablet at 01/01/17 0819  . traMADol (ULTRAM) tablet 50 mg  50 mg Oral Q12H PRN Bettey Costa, MD   50 mg at 01/01/17 1525    Past Medical History:  Diagnosis Date  . Hyperlipidemia   . Hypertension   . Prostate cancer [facility])    metastasized    Past Surgical History:  Procedure Laterality Date  . APPENDECTOMY      Social History Social History   Tobacco Use  . Smoking status: Never Smoker  . Smokeless tobacco: Never Used  Substance Use Topics  .  Alcohol use: No    Alcohol/week: 0.0 oz    Frequency: Never    Comment: occ  . Drug use: No    Family History Family History  Problem Relation Age of Onset  . Hypertension Mother   . Hypertension Father   . Hyperlipidemia Father   No family history of bleeding/clotting disorders, porphyria or autoimmune disease   Allergies  Allergen Reactions  . No Known Allergies      REVIEW OF SYSTEMS (Negative unless checked)  Constitutional: [] Weight loss  [] Fever  [] Chills Cardiac: [x] Chest pain   [] Chest pressure   [] Palpitations   [x] Shortness of breath when laying flat   [x] Shortness of breath at  rest   [x] Shortness of breath with exertion. Vascular:  [] Pain in legs with walking   [] Pain in legs at rest   [] Pain in legs when laying flat   [] Claudication   [] Pain in feet when walking  [] Pain in feet at rest  [] Pain in feet when laying flat   [x] History of DVT   [] Phlebitis   [x] Swelling in legs   [] Varicose veins   [] Non-healing ulcers Pulmonary:   [] Uses home oxygen   [] Productive cough   [] Hemoptysis   [] Wheeze  [] COPD   [] Asthma Neurologic:  [] Dizziness  [] Blackouts   [] Seizures   [] History of stroke   [] History of TIA  [] Aphasia   [] Temporary blindness   [] Dysphagia   [] Weakness or numbness in arms   [] Weakness or numbness in legs Musculoskeletal:  [] Arthritis   [] Joint swelling   [] Joint pain   [] Low back pain Hematologic:  [] Easy bruising  [] Easy bleeding   [] Hypercoagulable state   [] Anemic  [] Hepatitis Gastrointestinal:  [] Blood in stool   [] Vomiting blood  [] Gastroesophageal reflux/heartburn   [] Difficulty swallowing. Genitourinary:  [] Chronic kidney disease   [] Difficult urination  [] Frequent urination  [] Burning with urination   [] Blood in urine Skin:  [] Rashes   [] Ulcers   [] Wounds Psychological:  [] History of anxiety   []  History of major depression.  Physical Examination  Vitals:   01/01/17 0407 01/01/17 0823 01/01/17 0823 01/01/17 1513  BP: 110/72 128/88 128/88 119/69  Pulse:  100 (!) 108 (!) 103  Resp: 18   20  Temp: 98.4 F (36.9 C)   97.6 F (36.4 C)  TempSrc: Oral   Oral  SpO2: 99%  97% 100%  Weight:      Height:       Body mass index is 30.42 kg/m. Gen:  WD/WN, NAD Head: [place]/AT, No temporalis wasting. Prominent temp pulse not noted. Ear/Nose/Throat: Hearing grossly intact, nares w/o erythema or drainage, oropharynx w/o Erythema/Exudate Eyes: Sclera non-icteric, conjunctiva clear Neck: Trachea midline.  No JVD.  Pulmonary:  Good air movement, respirations not labored, equal bilaterally.  Cardiac: RRR, normal S1, S2. Vascular: 3+ bilateral lower extremity  edema Vessel Right Left  Radial Palpable Palpable  Gastrointestinal: soft, non-tender/non-distended. No guarding/reflex.  Musculoskeletal: M/S 5/5 throughout.  Extremities without ischemic changes.  No deformity or atrophy. No edema. Neurologic: Sensation grossly intact in extremities.  Symmetrical.  Speech is fluent. Motor exam as listed above. Psychiatric: Judgment intact, Mood & affect appropriate for pt's clinical situation. Dermatologic: No rashes or ulcers noted.  No cellulitis or open wounds. Lymph : No Cervical, Axillary, or Inguinal lymphadenopathy.      CBC Lab Results  Component Value Date   WBC 3.7 (L) 01/01/2017   HGB 11.1 (L) 01/01/2017   HCT 32.8 (L) 01/01/2017   MCV 88.6 01/01/2017   PLT  363 01/01/2017    BMET    Component Value Date/Time   NA 135 01/01/2017 0406   NA 137 05/20/2013 0642   K 3.9 01/01/2017 0406   K 3.6 05/20/2013 0642   CL 104 01/01/2017 0406   CL 102 05/20/2013 0642   CO2 23 01/01/2017 0406   CO2 28 05/20/2013 0642   GLUCOSE 99 01/01/2017 0406   GLUCOSE 100 (H) 05/20/2013 0642   BUN 9 01/01/2017 0406   BUN 7 05/20/2013 0642   CREATININE 0.88 01/01/2017 0406   CREATININE 1.01 05/20/2013 0642   CALCIUM 8.1 (L) 01/01/2017 0406   CALCIUM 8.7 05/20/2013 0642   GFRNONAA >60 01/01/2017 0406   GFRNONAA >60 05/20/2013 0642   GFRAA >60 01/01/2017 0406   GFRAA >60 05/20/2013 1610   Estimated Creatinine Clearance: 96.1 mL/min (by C-G formula based on SCr of 0.88 mg/dL).  COAG Lab Results  Component Value Date   INR 1.05 12/31/2016    Radiology Ct Angio Chest Pe W And/or Wo Contrast  Result Date: 12/31/2016 CLINICAL DATA:  Nausea and tachycardia for 3 days. History prostate cancer. EXAM: CT ANGIOGRAPHY CHEST CT ABDOMEN AND PELVIS WITH CONTRAST TECHNIQUE: Multidetector CT imaging of the chest was performed using the standard protocol during bolus administration of intravenous contrast. Multiplanar CT image reconstructions and MIPs were  obtained to evaluate the vascular anatomy. Multidetector CT imaging of the abdomen and pelvis was performed using the standard protocol during bolus administration of intravenous contrast. CONTRAST:  100 cc Isovue 370 COMPARISON:  Multiple exams, including 05/20/2013 and 04/12/2012 FINDINGS: CTA CHEST FINDINGS Cardiovascular: Acute pulmonary embolus with saddle embolus spanning from the right to the left pulmonary artery and considerable bilateral central thrombus in major branches of the right upper lobe, right middle lobe, right lower lobe, lingula, and left lower lobe pulmonary arterial tree. Clot burden is 5. Right ventricular to left ventricular ratio 1.2 compatible with right heart strain. Enlarged main pulmonary artery. Coronary, aortic arch, and branch vessel atherosclerotic vascular disease. Mild cardiomegaly. Mediastinum/Nodes: Extensive tumor nodularity along the epicardial/pericardial space. Right internal mammary adenopathy including a 1.3 cm node on image 39/5 somewhat confluent with the adjacent pleural tumor. A pre xiphoid node measures 1.3 cm in short axis on image 78/5. These nodes are new compared to the prior exam. Lungs/Pleura: Extensive pleural metastatic disease anteriorly in the right hemithorax and along the right hemidiaphragm. Moderate right pleural effusion is almost assuredly malignant. Musculoskeletal: No compelling findings of osseous thoracic metastatic disease. Review of the MIP images confirms the above findings. CT ABDOMEN and PELVIS FINDINGS Hepatobiliary: Tumor nodularity along the liver surface and adjacent perihepatic ascites with a thick rind of tumor along the perihepatic ascites. Tumor involvement along the falciform ligament may be invading the liver there is extensive tumor encasing portions of the gallbladder neck along the gallbladder fossa. The portal vein and hepatic vein appear patent. Pancreas: Unremarkable Spleen: The spleen appears unremarkable. Adrenals/Urinary  Tract: Stable mild nodularity along the medial limb of the left adrenal gland. Left mid kidney cyst. Wall thickening in the urinary bladder. Diverticular type outpouching of the urinary bladder along the prostatectomy site eccentric to the left, similar to the prior exam. Stomach/Bowel: No dilated bowel is identified. Massive tumor deposition along pelvic loops of bowel causing a conglomerate mass of tumor in bowel. Extensive mesenteric tumor implants. Wall thickening in the rectum. Vascular/Lymphatic: Aortoiliac atherosclerotic vascular disease. A gastrohepatic ligament node measures 1.3 cm in short axis on image 34/12 and is new compared  to the 2014 exam. Infrarenal abdominal aortic aneurysm up to 3.4 cm transverse. Ectatic bilateral external iliac arteries. Reproductive: Prostatectomy. Reservoir for a penile prosthesis noted in the right abdomen. Other: Malignant ascites with extensive tumor deposition. Extensive mesenteric and omental tumor deposition. Mild presacral edema. Musculoskeletal: Lower lumbar spondylosis and degenerative disc disease. There several small sclerotic lesions favoring metastatic disease including a 1.5 cm lesion in the right ischium on image 65/15, and a 0.9 cm sclerotic lesion in the left iliac bone on image 17/15. Review of the MIP images confirms the above findings. IMPRESSION: 1. Large amount of pulmonary embolus including bilateral saddle embolus and thrombus crossing the midline from the right to the left pulmonary arteries. Positive for acute PE with CT evidence of right heart strain (RV/LV Ratio = 1.2) consistent with at least submassive (intermediate risk) PE. The presence of right heart strain has been associated with an increased risk of morbidity and mortality. Please activate Code PE by paging [phone]. 2. Marked amount of tumor rind anteriorly along the right pleural space with likely malignant right pleural effusion, as well as extensive peritoneal spread of tumor with  malignant ascites and encasement of bowel loops by tumor in the pelvis. There are several small osseous metastatic lesions in the bony pelvis as well. 3. Other imaging findings of potential clinical significance: Aortic Atherosclerosis (ICD10-I70.0). Cardiomegaly and coronary atherosclerosis. 4. Nonspecific wall thickening in the rectum. Critical Value/emergent results were called by telephone at the time of interpretation on 12/31/2016 at 3:05 pm to Dr. Eula Listen , who verbally acknowledged these results. Electronically Signed   By: Van Clines M.D.   On: 12/31/2016 15:16   Ct Abdomen Pelvis W Contrast  Result Date: 12/31/2016 CLINICAL DATA:  Nausea and tachycardia for 3 days. History prostate cancer. EXAM: CT ANGIOGRAPHY CHEST CT ABDOMEN AND PELVIS WITH CONTRAST TECHNIQUE: Multidetector CT imaging of the chest was performed using the standard protocol during bolus administration of intravenous contrast. Multiplanar CT image reconstructions and MIPs were obtained to evaluate the vascular anatomy. Multidetector CT imaging of the abdomen and pelvis was performed using the standard protocol during bolus administration of intravenous contrast. CONTRAST:  100 cc Isovue 370 COMPARISON:  Multiple exams, including 05/20/2013 and 04/12/2012 FINDINGS: CTA CHEST FINDINGS Cardiovascular: Acute pulmonary embolus with saddle embolus spanning from the right to the left pulmonary artery and considerable bilateral central thrombus in major branches of the right upper lobe, right middle lobe, right lower lobe, lingula, and left lower lobe pulmonary arterial tree. Clot burden is 5. Right ventricular to left ventricular ratio 1.2 compatible with right heart strain. Enlarged main pulmonary artery. Coronary, aortic arch, and branch vessel atherosclerotic vascular disease. Mild cardiomegaly. Mediastinum/Nodes: Extensive tumor nodularity along the epicardial/pericardial space. Right internal mammary adenopathy  including a 1.3 cm node on image 39/5 somewhat confluent with the adjacent pleural tumor. A pre xiphoid node measures 1.3 cm in short axis on image 78/5. These nodes are new compared to the prior exam. Lungs/Pleura: Extensive pleural metastatic disease anteriorly in the right hemithorax and along the right hemidiaphragm. Moderate right pleural effusion is almost assuredly malignant. Musculoskeletal: No compelling findings of osseous thoracic metastatic disease. Review of the MIP images confirms the above findings. CT ABDOMEN and PELVIS FINDINGS Hepatobiliary: Tumor nodularity along the liver surface and adjacent perihepatic ascites with a thick rind of tumor along the perihepatic ascites. Tumor involvement along the falciform ligament may be invading the liver there is extensive tumor encasing portions of the gallbladder neck along  the gallbladder fossa. The portal vein and hepatic vein appear patent. Pancreas: Unremarkable Spleen: The spleen appears unremarkable. Adrenals/Urinary Tract: Stable mild nodularity along the medial limb of the left adrenal gland. Left mid kidney cyst. Wall thickening in the urinary bladder. Diverticular type outpouching of the urinary bladder along the prostatectomy site eccentric to the left, similar to the prior exam. Stomach/Bowel: No dilated bowel is identified. Massive tumor deposition along pelvic loops of bowel causing a conglomerate mass of tumor in bowel. Extensive mesenteric tumor implants. Wall thickening in the rectum. Vascular/Lymphatic: Aortoiliac atherosclerotic vascular disease. A gastrohepatic ligament node measures 1.3 cm in short axis on image 34/12 and is new compared to the 2014 exam. Infrarenal abdominal aortic aneurysm up to 3.4 cm transverse. Ectatic bilateral external iliac arteries. Reproductive: Prostatectomy. Reservoir for a penile prosthesis noted in the right abdomen. Other: Malignant ascites with extensive tumor deposition. Extensive mesenteric and omental  tumor deposition. Mild presacral edema. Musculoskeletal: Lower lumbar spondylosis and degenerative disc disease. There several small sclerotic lesions favoring metastatic disease including a 1.5 cm lesion in the right ischium on image 65/15, and a 0.9 cm sclerotic lesion in the left iliac bone on image 17/15. Review of the MIP images confirms the above findings. IMPRESSION: 1. Large amount of pulmonary embolus including bilateral saddle embolus and thrombus crossing the midline from the right to the left pulmonary arteries. Positive for acute PE with CT evidence of right heart strain (RV/LV Ratio = 1.2) consistent with at least submassive (intermediate risk) PE. The presence of right heart strain has been associated with an increased risk of morbidity and mortality. Please activate Code PE by paging [phone]. 2. Marked amount of tumor rind anteriorly along the right pleural space with likely malignant right pleural effusion, as well as extensive peritoneal spread of tumor with malignant ascites and encasement of bowel loops by tumor in the pelvis. There are several small osseous metastatic lesions in the bony pelvis as well. 3. Other imaging findings of potential clinical significance: Aortic Atherosclerosis (ICD10-I70.0). Cardiomegaly and coronary atherosclerosis. 4. Nonspecific wall thickening in the rectum. Critical Value/emergent results were called by telephone at the time of interpretation on 12/31/2016 at 3:05 pm to Dr. Eula Listen , who verbally acknowledged these results. Electronically Signed   By: Van Clines M.D.   On: 12/31/2016 15:16   US Venous Img Lower Bilateral  Result Date: 01/01/2017 CLINICAL DATA:  76 year old male with a history of P EXAM: BILATERAL LOWER EXTREMITY VENOUS DOPPLER ULTRASOUND TECHNIQUE: Gray-scale sonography with graded compression, as well as color Doppler and duplex ultrasound were performed to evaluate the lower extremity deep venous systems from the  level of the common femoral vein and including the common femoral, femoral, profunda femoral, popliteal and calf veins including the posterior tibial, peroneal and gastrocnemius veins when visible. The superficial great saphenous vein was also interrogated. Spectral Doppler was utilized to evaluate flow at rest and with distal augmentation maneuvers in the common femoral, femoral and popliteal veins. COMPARISON:  None. FINDINGS: RIGHT LOWER EXTREMITY Common Femoral Vein: No evidence of thrombus. Normal compressibility, respiratory phasicity and response to augmentation. Saphenofemoral Junction: No evidence of thrombus. Normal compressibility and flow on color Doppler imaging. Profunda Femoral Vein: No evidence of thrombus. Normal compressibility and flow on color Doppler imaging. Femoral Vein: No evidence of thrombus. Normal compressibility, respiratory phasicity and response to augmentation. Popliteal Vein: No evidence of thrombus. Normal compressibility, respiratory phasicity and response to augmentation. Calf Veins: No evidence of thrombus. Normal compressibility and  flow on color Doppler imaging. Superficial Great Saphenous Vein: No evidence of thrombus. Normal compressibility and flow on color Doppler imaging. Other Findings:  None. LEFT LOWER EXTREMITY Common Femoral Vein: No evidence of thrombus. Normal compressibility, respiratory phasicity and response to augmentation. Saphenofemoral Junction: No evidence of thrombus. Normal compressibility and flow on color Doppler imaging. Profunda Femoral Vein: No evidence of thrombus. Normal compressibility and flow on color Doppler imaging. Femoral Vein: No evidence of thrombus. Normal compressibility, respiratory phasicity and response to augmentation. Popliteal Vein: No evidence of thrombus. Normal compressibility, respiratory phasicity and response to augmentation. Calf Veins: No evidence of thrombus. Normal compressibility and flow on color Doppler imaging.  Superficial Great Saphenous Vein: No evidence of thrombus. Normal compressibility and flow on color Doppler imaging. Other Findings:  None. IMPRESSION: Sonographic survey of the bilateral lower extremities negative for DVT Electronically Signed   By: Corrie Mckusick D.O.   On: 01/01/2017 12:45      Assessment/Plan 1.  DVT with PE: I would continue anticoagulation at this point.  Given the patient's history of GI bleeding as well as his medical comorbidities and his diffusely metastatic prostate cancer do not believe thrombolysis is in his best interest.  At the present time he is quite comfortable on minimal oxygen and seems to be tolerating his clot burden quite well.  At this point he is tolerating anticoagulation and therefore filter is not indicated. 2.  Metastatic prostate cancer: Patient has been referred to hospice and will undergo evaluation while here. 3.  Atrial fibrillation: New onset likely secondary to his pulmonary emboli.  Currently on beta-blockers.  Also on anticoagulation.   Hortencia Pilar, MD  01/01/2017 5:57 PM    This note was created with Dragon medical transcription system.  Any error is purely unintentional

## 2017-01-01 NOTE — Progress Notes (Addendum)
ANTICOAGULATION CONSULT NOTE - Initial Consult  Pharmacy Consult for heparin Indication: atrial fibrillation and multiple PE  Allergies  Allergen Reactions  . No Known Allergies     Patient Measurements: Height: 6\' 2"  (188 cm) Weight: 236 lb 14.4 oz (107.5 kg) IBW/kg (Calculated) : 82.2 Heparin Dosing Weight: 105.9 kg  Vital Signs: Temp: 97.6 F (36.4 C) (11/30 1513) Temp Source: Oral (11/30 1513) BP: 119/69 (11/30 1513) Pulse Rate: 103 (11/30 1513)  Labs: Recent Labs    12/31/16 1305 12/31/16 2254 01/01/17 0406 01/01/17 1755  HGB 12.5*  --  11.1*  --   HCT 37.5*  --  32.8*  --   PLT 394  --  363  --   APTT 34  --   --   --   LABPROT 13.6  --   --   --   INR 1.05  --   --   --   HEPARINUNFRC  --  <0.10*  --  0.22*  CREATININE 1.00  --  0.88  --   TROPONINI 0.07*  --   --   --     Estimated Creatinine Clearance: 96.1 mL/min (by C-G formula based on SCr of 0.88 mg/dL).   Medical History: Past Medical History:  Diagnosis Date  . Hyperlipidemia   . Hypertension   . Prostate cancer ([place])    metastasized    Medications:  Patient is not on any oral anticoagulants including aspirin at home because he had a bleeding ulcer a few months ago.   Assessment: 76 yo male presented to ED with dizziness and emesis found to be in a fib RVR and Imaging also revealed a large pulmonary artery saddle embolus with multiple other PEs and evidence of heart strain.  Heparin drip was originally started on 11/29, but discontinued due to patient preference. Patient has recent history of ulcer bleeding.  11/30 Patient agreed to restart heparin drip. Pharmacy consulted for dosing and monitoring.   Goal of Therapy:  Heparin level 0.3-0.7 units/ml Monitor platelets by anticoagulation protocol: Yes   Plan:  Due to recent hx of GI bleed, will not bolus.  Start heparin infusion at 1450 units/hr Check anti-Xa level in 6 hours and daily while on heparin Continue to monitor H&H and  platelets  01/01/2017 1755 HL subtherapeutic. Will give half bolus due to hx GIB 800 units IV x 1 and increase rate to 1650 units/hr. Will recheck HL in 8 hours.  Laural Benes, PharmD, BCPS Clinical Pharmacist 01/01/2017 6:40 PM    12/01 0300 heparin level 0.47. Continue current regimen. Recheck in 8 hours to confirm  Sim Boast, PharmD, BCPS  01/02/17 4:13 AM

## 2017-01-02 LAB — HEPARIN LEVEL (UNFRACTIONATED): HEPARIN UNFRACTIONATED: 0.47 [IU]/mL (ref 0.30–0.70)

## 2017-01-02 LAB — BASIC METABOLIC PANEL
Anion gap: 6 (ref 5–15)
BUN: 8 mg/dL (ref 6–20)
CALCIUM: 8.1 mg/dL — AB (ref 8.9–10.3)
CHLORIDE: 102 mmol/L (ref 101–111)
CO2: 25 mmol/L (ref 22–32)
CREATININE: 0.84 mg/dL (ref 0.61–1.24)
GFR calc non Af Amer: >60 mL/min (ref >60)
Glucose, Bld: 101 mg/dL — ABNORMAL HIGH (ref 65–99)
Potassium: 3.8 mmol/L (ref 3.5–5.1)
SODIUM: 133 mmol/L — AB (ref 135–145)

## 2017-01-02 LAB — CBC
HCT: 31.9 % — ABNORMAL LOW (ref 40.0–52.0)
Hemoglobin: 10.6 g/dL — ABNORMAL LOW (ref 13.0–18.0)
MCH: 29.4 pg (ref 26.0–34.0)
MCHC: 33.4 g/dL (ref 32.0–36.0)
MCV: 88.2 fL (ref 80.0–100.0)
PLATELETS: 365 10*3/uL (ref 150–440)
RBC: 3.62 MIL/uL — ABNORMAL LOW (ref 4.40–5.90)
RDW: 15.8 % — AB (ref 11.5–14.5)
WBC: 3.1 10*3/uL — AB (ref 3.8–10.6)

## 2017-01-02 MED ORDER — DIPHENHYDRAMINE HCL 25 MG PO CAPS
25.0000 mg | ORAL_CAPSULE | Freq: Every evening | ORAL | Status: DC | PRN
  Administered 2017-01-02: 01:00:00 25 mg via ORAL
  Filled 2017-01-02: qty 1

## 2017-01-02 MED ORDER — PANTOPRAZOLE SODIUM 40 MG PO TBEC: 40.0000 mg | DELAYED_RELEASE_TABLET | Freq: Every day | ORAL | 0 refills | Status: AC

## 2017-01-02 MED ORDER — APIXABAN 5 MG PO TABS
10.0000 mg | ORAL_TABLET | Freq: Two times a day (BID) | ORAL | Status: DC
  Administered 2017-01-02: 10:00:00 10 mg via ORAL
  Filled 2017-01-02: qty 2

## 2017-01-02 MED ORDER — APIXABAN 5 MG PO TABS: 5.0000 mg | ORAL_TABLET | Freq: Two times a day (BID) | ORAL | 0 refills | Status: AC

## 2017-01-02 MED ORDER — APIXABAN 5 MG PO TABS: 10.0000 mg | ORAL_TABLET | Freq: Two times a day (BID) | ORAL | 0 refills | Status: AC

## 2017-01-02 MED ORDER — APIXABAN 5 MG PO TABS: 5.0000 mg | ORAL_TABLET | Freq: Two times a day (BID) | ORAL | Status: DC

## 2017-01-02 NOTE — Discharge Summary (Signed)
[facility] NAME: Daniel Estes    MR#:  [id number]  DATE OF BIRTH:  1940-07-23  DATE OF ADMISSION:  12/31/2016 ADMITTING PHYSICIAN: Vaughan Basta, MD  DATE OF DISCHARGE: 01/02/2017  PRIMARY CARE PHYSICIAN: McLean-Scocuzza, Nino Glow, MD    ADMISSION DIAGNOSIS:  Metastatic disease ([place]) [C79.9] NSTEMI (non-ST elevated myocardial infarction) ([place]) [I21.4] Atrial fibrillation with RVR ([place]) [I48.91] Acute saddle pulmonary embolism with acute cor pulmonale ([place]) [I26.02]  DISCHARGE DIAGNOSIS:  Principal Problem:   Pulmonary embolism ([place])   SECONDARY DIAGNOSIS:   Past Medical History:  Diagnosis Date  . Hyperlipidemia   . Hypertension   . Prostate cancer [facility])    metastasized    HOSPITAL COURSE:   76 year old male who was a Jehovah's witness (no longer) with a history of metastatic prostate cancer recently referred to hospice who presents with shortness of breath.  1. Large bilateral saddle pulmonary emboli with right heart strain: Patient was initiated on heparin drip. He will be discharged on oral anticoagulation. He understands side effects, alternatives, risks and benefits of anticoagulation and accepts these risks. He was evaluated by vascular surgery due to his poor prognosis and life expectancy less than 3 months thrombectomy is not indicated this time. Echocardiogram did not show evidence of right heart strain. Lower extremity Dopplers did not show evidence of DVT.   2. Metastatic prostate cancer: Patient's prognosis is less than 3 months according to [facility] oncology where he follows up. Patient will be discharged with hospice.  3. Essential hypertension:  4. Elevated troponin: This is due to demand ischemia from large bilateral saddle pulmonary emboli. This is not acute coronary syndrome.    DISCHARGE CONDITIONS AND DIET:   sTable for discharge and regular diet  CONSULTS OBTAINED:  Treatment Team:   Katha Cabal, MD  DRUG ALLERGIES:   Allergies  Allergen Reactions  . No Known Allergies     DISCHARGE MEDICATIONS:   Allergies as of 01/02/2017      Reactions   No Known Allergies       Medication List    STOP taking these medications   amLODipine 10 MG tablet Commonly known as:  NORVASC   atenolol 100 MG tablet Commonly known as:  TENORMIN   dexamethasone 4 MG tablet Commonly known as:  DECADRON   furosemide 20 MG tablet Commonly known as:  LASIX   gabapentin 600 MG tablet Commonly known as:  NEURONTIN   Leuprolide Acetate (6 Month) 45 MG injection Commonly known as:  LUPRON   losartan 100 MG tablet Commonly known as:  COZAAR   multivitamin capsule   NONFORMULARY OR COMPOUNDED ITEM   omeprazole 40 MG capsule Commonly known as:  PRILOSEC   potassium chloride 10 MEQ tablet Commonly known as:  K-DUR     TAKE these medications   acetaminophen 500 MG tablet Commonly known as:  TYLENOL Take 500-1,000 mg by mouth every 6 (six) hours as needed.   apixaban 5 MG Tabs tablet Commonly known as:  ELIQUIS Take 2 tablets (10 mg total) by mouth 2 (two) times daily for 6 days.   apixaban 5 MG Tabs tablet Commonly known as:  ELIQUIS Take 1 tablet (5 mg total) by mouth 2 (two) times daily. Start taking on:  01/09/2017   pantoprazole 40 MG tablet Commonly known as:  PROTONIX Take 1 tablet (40 mg total) by mouth daily.   predniSONE 10 MG tablet Commonly known as:  DELTASONE Take 10 mg  by mouth daily with breakfast. Hold on days taking dexamethasone.   prochlorperazine 10 MG tablet Commonly known as:  COMPAZINE Take 10 mg by mouth every 6 (six) hours as needed for nausea/vomiting.   traMADol 50 MG tablet Commonly known as:  ULTRAM Take 1 tablet (50 mg total) by mouth every 12 (twelve) hours as needed for severe pain.            Durable Medical Equipment  (From admission, onward)        Start     Ordered   01/02/17 0822  DME Oxygen  Once     Question Answer Comment  Mode or (Route) Nasal cannula   Frequency Continuous (stationary and portable oxygen unit needed)   Oxygen conserving device Yes   Oxygen delivery system Gas      01/02/17 0822        Today   CHIEF COMPLAINT:  Doing well    VITAL SIGNS:  Blood pressure 124/82, pulse 81, temperature 98.5 F (36.9 C), temperature source Oral, resp. rate 20, height 6\' 2"  (1.88 m), weight 107.5 kg (236 lb 14.4 oz), SpO2 98 %.   REVIEW OF SYSTEMS:  Review of Systems  Constitutional: Negative.  Negative for chills, fever and malaise/fatigue.  HENT: Negative.  Negative for ear discharge, ear pain, hearing loss, nosebleeds and sore throat.   Eyes: Negative.  Negative for blurred vision and pain.  Respiratory: Negative.  Negative for cough, hemoptysis, shortness of breath and wheezing.   Cardiovascular: Negative.  Negative for chest pain, palpitations and leg swelling.  Gastrointestinal: Negative.  Negative for abdominal pain, blood in stool, diarrhea, nausea and vomiting.  Genitourinary: Negative.  Negative for dysuria.  Musculoskeletal: Negative.  Negative for back pain.  Skin: Negative.   Neurological: Negative for dizziness, tremors, speech change, focal weakness, seizures and headaches.  Endo/Heme/Allergies: Negative.  Does not bruise/bleed easily.  Psychiatric/Behavioral: Negative.  Negative for depression, hallucinations and suicidal ideas.     PHYSICAL EXAMINATION:  GENERAL:  76 y.o.-year-old patient lying in the bed with no acute distress.  NECK:  Supple, no jugular venous distention. No thyroid enlargement, no tenderness.  LUNGS: Normal breath sounds bilaterally, no wheezing, rales,rhonchi  No use of accessory muscles of respiration.  CARDIOVASCULAR: S1, S2 normal. No murmurs, rubs, or gallops.  ABDOMEN: Soft, non-tender, non-distended. Bowel sounds present. No organomegaly or mass.  EXTREMITIES: No pedal edema, cyanosis, or clubbing.  PSYCHIATRIC: The  patient is alert and oriented x 3.  SKIN: No obvious rash, lesion, or ulcer.   DATA REVIEW:   CBC Recent Labs  Lab 01/02/17 0319  WBC 3.1*  HGB 10.6*  HCT 31.9*  PLT 365    Chemistries  Recent Labs  Lab 12/31/16 1305  01/02/17 0319  NA 136   < > 133*  K 4.3   < > 3.8  CL 98*   < > 102  CO2 24   < > 25  GLUCOSE 102*   < > 101*  BUN 8   < > 8  CREATININE 1.00   < > 0.84  CALCIUM 8.7*   < > 8.1*  AST 61*  --   --   ALT 18  --   --   ALKPHOS 45  --   --   BILITOT 1.2  --   --    < > = values in this interval not displayed.    Cardiac Enzymes Recent Labs  Lab 12/31/16 1305  TROPONINI 0.07*  Microbiology Results  @[email or web address] @  RADIOLOGY:  Ct Angio Chest Pe W And/or Wo Contrast  Result Date: 12/31/2016 CLINICAL DATA:  Nausea and tachycardia for 3 days. History prostate cancer. EXAM: CT ANGIOGRAPHY CHEST CT ABDOMEN AND PELVIS WITH CONTRAST TECHNIQUE: Multidetector CT imaging of the chest was performed using the standard protocol during bolus administration of intravenous contrast. Multiplanar CT image reconstructions and MIPs were obtained to evaluate the vascular anatomy. Multidetector CT imaging of the abdomen and pelvis was performed using the standard protocol during bolus administration of intravenous contrast. CONTRAST:  100 cc Isovue 370 COMPARISON:  Multiple exams, including 05/20/2013 and 04/12/2012 FINDINGS: CTA CHEST FINDINGS Cardiovascular: Acute pulmonary embolus with saddle embolus spanning from the right to the left pulmonary artery and considerable bilateral central thrombus in major branches of the right upper lobe, right middle lobe, right lower lobe, lingula, and left lower lobe pulmonary arterial tree. Clot burden is 5. Right ventricular to left ventricular ratio 1.2 compatible with right heart strain. Enlarged main pulmonary artery. Coronary, aortic arch, and branch vessel atherosclerotic vascular disease. Mild cardiomegaly. Mediastinum/Nodes:  Extensive tumor nodularity along the epicardial/pericardial space. Right internal mammary adenopathy including a 1.3 cm node on image 39/5 somewhat confluent with the adjacent pleural tumor. A pre xiphoid node measures 1.3 cm in short axis on image 78/5. These nodes are new compared to the prior exam. Lungs/Pleura: Extensive pleural metastatic disease anteriorly in the right hemithorax and along the right hemidiaphragm. Moderate right pleural effusion is almost assuredly malignant. Musculoskeletal: No compelling findings of osseous thoracic metastatic disease. Review of the MIP images confirms the above findings. CT ABDOMEN and PELVIS FINDINGS Hepatobiliary: Tumor nodularity along the liver surface and adjacent perihepatic ascites with a thick rind of tumor along the perihepatic ascites. Tumor involvement along the falciform ligament may be invading the liver there is extensive tumor encasing portions of the gallbladder neck along the gallbladder fossa. The portal vein and hepatic vein appear patent. Pancreas: Unremarkable Spleen: The spleen appears unremarkable. Adrenals/Urinary Tract: Stable mild nodularity along the medial limb of the left adrenal gland. Left mid kidney cyst. Wall thickening in the urinary bladder. Diverticular type outpouching of the urinary bladder along the prostatectomy site eccentric to the left, similar to the prior exam. Stomach/Bowel: No dilated bowel is identified. Massive tumor deposition along pelvic loops of bowel causing a conglomerate mass of tumor in bowel. Extensive mesenteric tumor implants. Wall thickening in the rectum. Vascular/Lymphatic: Aortoiliac atherosclerotic vascular disease. A gastrohepatic ligament node measures 1.3 cm in short axis on image 34/12 and is new compared to the 2014 exam. Infrarenal abdominal aortic aneurysm up to 3.4 cm transverse. Ectatic bilateral external iliac arteries. Reproductive: Prostatectomy. Reservoir for a penile prosthesis noted in the right  abdomen. Other: Malignant ascites with extensive tumor deposition. Extensive mesenteric and omental tumor deposition. Mild presacral edema. Musculoskeletal: Lower lumbar spondylosis and degenerative disc disease. There several small sclerotic lesions favoring metastatic disease including a 1.5 cm lesion in the right ischium on image 65/15, and a 0.9 cm sclerotic lesion in the left iliac bone on image 17/15. Review of the MIP images confirms the above findings. IMPRESSION: 1. Large amount of pulmonary embolus including bilateral saddle embolus and thrombus crossing the midline from the right to the left pulmonary arteries. Positive for acute PE with CT evidence of right heart strain (RV/LV Ratio = 1.2) consistent with at least submassive (intermediate risk) PE. The presence of right heart strain has been associated with an increased risk of morbidity and  mortality. Please activate Code PE by paging [phone]. 2. Marked amount of tumor rind anteriorly along the right pleural space with likely malignant right pleural effusion, as well as extensive peritoneal spread of tumor with malignant ascites and encasement of bowel loops by tumor in the pelvis. There are several small osseous metastatic lesions in the bony pelvis as well. 3. Other imaging findings of potential clinical significance: Aortic Atherosclerosis (ICD10-I70.0). Cardiomegaly and coronary atherosclerosis. 4. Nonspecific wall thickening in the rectum. Critical Value/emergent results were called by telephone at the time of interpretation on 12/31/2016 at 3:05 pm to Dr. Eula Listen , who verbally acknowledged these results. Electronically Signed   By: Van Clines M.D.   On: 12/31/2016 15:16   Ct Abdomen Pelvis W Contrast  Result Date: 12/31/2016 CLINICAL DATA:  Nausea and tachycardia for 3 days. History prostate cancer. EXAM: CT ANGIOGRAPHY CHEST CT ABDOMEN AND PELVIS WITH CONTRAST TECHNIQUE: Multidetector CT imaging of the chest was  performed using the standard protocol during bolus administration of intravenous contrast. Multiplanar CT image reconstructions and MIPs were obtained to evaluate the vascular anatomy. Multidetector CT imaging of the abdomen and pelvis was performed using the standard protocol during bolus administration of intravenous contrast. CONTRAST:  100 cc Isovue 370 COMPARISON:  Multiple exams, including 05/20/2013 and 04/12/2012 FINDINGS: CTA CHEST FINDINGS Cardiovascular: Acute pulmonary embolus with saddle embolus spanning from the right to the left pulmonary artery and considerable bilateral central thrombus in major branches of the right upper lobe, right middle lobe, right lower lobe, lingula, and left lower lobe pulmonary arterial tree. Clot burden is 5. Right ventricular to left ventricular ratio 1.2 compatible with right heart strain. Enlarged main pulmonary artery. Coronary, aortic arch, and branch vessel atherosclerotic vascular disease. Mild cardiomegaly. Mediastinum/Nodes: Extensive tumor nodularity along the epicardial/pericardial space. Right internal mammary adenopathy including a 1.3 cm node on image 39/5 somewhat confluent with the adjacent pleural tumor. A pre xiphoid node measures 1.3 cm in short axis on image 78/5. These nodes are new compared to the prior exam. Lungs/Pleura: Extensive pleural metastatic disease anteriorly in the right hemithorax and along the right hemidiaphragm. Moderate right pleural effusion is almost assuredly malignant. Musculoskeletal: No compelling findings of osseous thoracic metastatic disease. Review of the MIP images confirms the above findings. CT ABDOMEN and PELVIS FINDINGS Hepatobiliary: Tumor nodularity along the liver surface and adjacent perihepatic ascites with a thick rind of tumor along the perihepatic ascites. Tumor involvement along the falciform ligament may be invading the liver there is extensive tumor encasing portions of the gallbladder neck along the  gallbladder fossa. The portal vein and hepatic vein appear patent. Pancreas: Unremarkable Spleen: The spleen appears unremarkable. Adrenals/Urinary Tract: Stable mild nodularity along the medial limb of the left adrenal gland. Left mid kidney cyst. Wall thickening in the urinary bladder. Diverticular type outpouching of the urinary bladder along the prostatectomy site eccentric to the left, similar to the prior exam. Stomach/Bowel: No dilated bowel is identified. Massive tumor deposition along pelvic loops of bowel causing a conglomerate mass of tumor in bowel. Extensive mesenteric tumor implants. Wall thickening in the rectum. Vascular/Lymphatic: Aortoiliac atherosclerotic vascular disease. A gastrohepatic ligament node measures 1.3 cm in short axis on image 34/12 and is new compared to the 2014 exam. Infrarenal abdominal aortic aneurysm up to 3.4 cm transverse. Ectatic bilateral external iliac arteries. Reproductive: Prostatectomy. Reservoir for a penile prosthesis noted in the right abdomen. Other: Malignant ascites with extensive tumor deposition. Extensive mesenteric and omental tumor deposition. Mild presacral  edema. Musculoskeletal: Lower lumbar spondylosis and degenerative disc disease. There several small sclerotic lesions favoring metastatic disease including a 1.5 cm lesion in the right ischium on image 65/15, and a 0.9 cm sclerotic lesion in the left iliac bone on image 17/15. Review of the MIP images confirms the above findings. IMPRESSION: 1. Large amount of pulmonary embolus including bilateral saddle embolus and thrombus crossing the midline from the right to the left pulmonary arteries. Positive for acute PE with CT evidence of right heart strain (RV/LV Ratio = 1.2) consistent with at least submassive (intermediate risk) PE. The presence of right heart strain has been associated with an increased risk of morbidity and mortality. Please activate Code PE by paging [phone]. 2. Marked amount of  tumor rind anteriorly along the right pleural space with likely malignant right pleural effusion, as well as extensive peritoneal spread of tumor with malignant ascites and encasement of bowel loops by tumor in the pelvis. There are several small osseous metastatic lesions in the bony pelvis as well. 3. Other imaging findings of potential clinical significance: Aortic Atherosclerosis (ICD10-I70.0). Cardiomegaly and coronary atherosclerosis. 4. Nonspecific wall thickening in the rectum. Critical Value/emergent results were called by telephone at the time of interpretation on 12/31/2016 at 3:05 pm to Dr. Eula Listen , who verbally acknowledged these results. Electronically Signed   By: Van Clines M.D.   On: 12/31/2016 15:16   US Venous Img Lower Bilateral  Result Date: 01/01/2017 CLINICAL DATA:  76 year old male with a history of P EXAM: BILATERAL LOWER EXTREMITY VENOUS DOPPLER ULTRASOUND TECHNIQUE: Gray-scale sonography with graded compression, as well as color Doppler and duplex ultrasound were performed to evaluate the lower extremity deep venous systems from the level of the common femoral vein and including the common femoral, femoral, profunda femoral, popliteal and calf veins including the posterior tibial, peroneal and gastrocnemius veins when visible. The superficial great saphenous vein was also interrogated. Spectral Doppler was utilized to evaluate flow at rest and with distal augmentation maneuvers in the common femoral, femoral and popliteal veins. COMPARISON:  None. FINDINGS: RIGHT LOWER EXTREMITY Common Femoral Vein: No evidence of thrombus. Normal compressibility, respiratory phasicity and response to augmentation. Saphenofemoral Junction: No evidence of thrombus. Normal compressibility and flow on color Doppler imaging. Profunda Femoral Vein: No evidence of thrombus. Normal compressibility and flow on color Doppler imaging. Femoral Vein: No evidence of thrombus. Normal  compressibility, respiratory phasicity and response to augmentation. Popliteal Vein: No evidence of thrombus. Normal compressibility, respiratory phasicity and response to augmentation. Calf Veins: No evidence of thrombus. Normal compressibility and flow on color Doppler imaging. Superficial Great Saphenous Vein: No evidence of thrombus. Normal compressibility and flow on color Doppler imaging. Other Findings:  None. LEFT LOWER EXTREMITY Common Femoral Vein: No evidence of thrombus. Normal compressibility, respiratory phasicity and response to augmentation. Saphenofemoral Junction: No evidence of thrombus. Normal compressibility and flow on color Doppler imaging. Profunda Femoral Vein: No evidence of thrombus. Normal compressibility and flow on color Doppler imaging. Femoral Vein: No evidence of thrombus. Normal compressibility, respiratory phasicity and response to augmentation. Popliteal Vein: No evidence of thrombus. Normal compressibility, respiratory phasicity and response to augmentation. Calf Veins: No evidence of thrombus. Normal compressibility and flow on color Doppler imaging. Superficial Great Saphenous Vein: No evidence of thrombus. Normal compressibility and flow on color Doppler imaging. Other Findings:  None. IMPRESSION: Sonographic survey of the bilateral lower extremities negative for DVT Electronically Signed   By: Corrie Mckusick D.O.   On: 01/01/2017 12:45  Allergies as of 01/02/2017      Reactions   No Known Allergies       Medication List    STOP taking these medications   amLODipine 10 MG tablet Commonly known as:  NORVASC   atenolol 100 MG tablet Commonly known as:  TENORMIN   dexamethasone 4 MG tablet Commonly known as:  DECADRON   furosemide 20 MG tablet Commonly known as:  LASIX   gabapentin 600 MG tablet Commonly known as:  NEURONTIN   Leuprolide Acetate (6 Month) 45 MG injection Commonly known as:  LUPRON   losartan 100 MG tablet Commonly known as:   COZAAR   multivitamin capsule   NONFORMULARY OR COMPOUNDED ITEM   omeprazole 40 MG capsule Commonly known as:  PRILOSEC   potassium chloride 10 MEQ tablet Commonly known as:  K-DUR     TAKE these medications   acetaminophen 500 MG tablet Commonly known as:  TYLENOL Take 500-1,000 mg by mouth every 6 (six) hours as needed.   apixaban 5 MG Tabs tablet Commonly known as:  ELIQUIS Take 2 tablets (10 mg total) by mouth 2 (two) times daily for 6 days.   apixaban 5 MG Tabs tablet Commonly known as:  ELIQUIS Take 1 tablet (5 mg total) by mouth 2 (two) times daily. Start taking on:  01/09/2017   pantoprazole 40 MG tablet Commonly known as:  PROTONIX Take 1 tablet (40 mg total) by mouth daily.   predniSONE 10 MG tablet Commonly known as:  DELTASONE Take 10 mg by mouth daily with breakfast. Hold on days taking dexamethasone.   prochlorperazine 10 MG tablet Commonly known as:  COMPAZINE Take 10 mg by mouth every 6 (six) hours as needed for nausea/vomiting.   traMADol 50 MG tablet Commonly known as:  ULTRAM Take 1 tablet (50 mg total) by mouth every 12 (twelve) hours as needed for severe pain.            Durable Medical Equipment  (From admission, onward)        Start     Ordered   01/02/17 0822  DME Oxygen  Once    Question Answer Comment  Mode or (Route) Nasal cannula   Frequency Continuous (stationary and portable oxygen unit needed)   Oxygen conserving device Yes   Oxygen delivery system Gas      01/02/17 0822         Management plans discussed with the patient and he is in agreement. Stable for discharge home with hospice  Patient should follow up with hospice  CODE STATUS:     Code Status Orders  (From admission, onward)        Start     Ordered   01/01/17 1729  Limited resuscitation (code)  Continuous    Comments:  Would like to be intubated for respiratory distress only. Not in case of arrest.  Question Answer Comment  In the event of  cardiac or respiratory ARREST: Initiate Code Blue, Call Rapid Response No   In the event of cardiac or respiratory ARREST: Perform CPR No   In the event of cardiac or respiratory ARREST: Perform Intubation/Mechanical Ventilation Yes   In the event of cardiac or respiratory ARREST: Use NIPPV/BiPAp only if indicated No   In the event of cardiac or respiratory ARREST: Administer ACLS medications if indicated No   In the event of cardiac or respiratory ARREST: Perform Defibrillation or Cardioversion if indicated No      01/01/17 1732  Code Status History    Date Active Date Inactive Code Status Order ID Comments User Context   12/31/2016 20:04 01/01/2017 17:17 DNR [id number]  Vaughan Basta, MD Inpatient   04/16/2016 17:49 04/17/2016 15:35 Full Code [id number]  Henreitta Leber, MD Inpatient    Advance Directive Documentation     Most Recent Value  Type of Advance Directive  Out of facility DNR (pink MOST or yellow form) [made by MD ]  Pre-existing out of facility DNR order (yellow form or pink MOST form)  No data  "MOST" Form in Place?  No data      TOTAL TIME TAKING CARE OF THIS PATIENT: 38 minutes.    Note: This dictation was prepared with Dragon dictation along with smaller phrase technology. Any transcriptional errors that result from this process are unintentional.  Leita Lindbloom M.D on 01/02/2017 at 8:22 AM  Between 7am to 6pm - Pager - [phone] After 6pm go to [email or web address] - password [email or web address] [facility]  Office  [phone]  CC: Primary care physician; McLean-Scocuzza, Nino Glow, MD

## 2017-01-02 NOTE — Consult Note (Signed)
ANTICOAGULATION CONSULT NOTE - Initial Consult  Pharmacy Consult for apixaban Indication: pulmonary embolus  Allergies  Allergen Reactions  . No Known Allergies     Patient Measurements: Height: 6\' 2"  (188 cm) Weight: 236 lb 14.4 oz (107.5 kg) IBW/kg (Calculated) : 82.2 Heparin Dosing Weight:   Vital Signs: Temp: 98.5 F (36.9 C) (12/01 0530) Temp Source: Oral (12/01 0530) BP: 124/82 (12/01 0530) Pulse Rate: 81 (12/01 0530)  Labs: Recent Labs    12/31/16 1305 12/31/16 2254 01/01/17 0406 01/01/17 1755 01/02/17 0319  HGB 12.5*  --  11.1*  --  10.6*  HCT 37.5*  --  32.8*  --  31.9*  PLT 394  --  363  --  365  APTT 34  --   --   --   --   LABPROT 13.6  --   --   --   --   INR 1.05  --   --   --   --   HEPARINUNFRC  --  <0.10*  --  0.22* 0.47  CREATININE 1.00  --  0.88  --  0.84  TROPONINI 0.07*  --   --   --   --     Estimated Creatinine Clearance: 100.7 mL/min (by C-G formula based on SCr of 0.84 mg/dL).   Medical History: Past Medical History:  Diagnosis Date  . Hyperlipidemia   . Hypertension   . Prostate cancer ([place])    metastasized    Medications:  Scheduled:  . apixaban  10 mg Oral BID   Followed by  . [START ON 01/09/2017] apixaban  5 mg Oral BID  . docusate sodium  100 mg Oral Daily  . gabapentin  600 mg Oral TID  . mouth rinse  15 mL Mouth Rinse BID  . metoprolol tartrate  25 mg Oral BID  . multivitamin with minerals  1 tablet Oral Daily  . pantoprazole  40 mg Oral BID  . predniSONE  10 mg Oral Q breakfast  . senna-docusate  1 tablet Oral Daily    Assessment: Pt is a 76 year old male with afib found to have PE. Pt currently on heparin drip. Pharmacy consulted to transition to apixaban. Pt has a hx of GI bleeding, but has agreed to anticoagulation due to circumstances.  Goal of Therapy:  Monitor platelets by anticoagulation protocol: Yes   Plan:  Apixaban 10mg  BID x 7 days then 5mg  BID. RN has been informed to turn heparin drip off when  she gives the first dose of apixaban.  Ramond Dial, Pharm.D, BCPS Clinical Pharmacist  01/02/2017,7:57 AM

## 2017-01-02 NOTE — Progress Notes (Signed)
SATURATION QUALIFICATIONS: (This note is used to comply with regulatory documentation for home oxygen)  Patient Saturations on Room Air at Rest = 92%  Patient Saturations on Room Air while Ambulating = 91%  Patient Saturations on 0 Liters of oxygen while Ambulating = 91%  Please briefly explain why patient needs home oxygen: HR up to 143 with ambulation.

## 2017-01-02 NOTE — Progress Notes (Signed)
Pt is being discharged home with hospice. Discharge papers given and explained to pt and spouse, both verbalized understanding. Meds and f/u appointment reviewed. RX given. Awaiting O2 tank.

## 2017-01-02 NOTE — Progress Notes (Signed)
Called Dr Benjie Karvonen to clarify starting date of Eliquis 5mg  BID. Per Dr Benjie Karvonen pt to take Eliquis 10mg  BID for 7 days. Starting date of Eliquis 5mg  BID remains 01/09/17. The above explained to pt and wife, both verbalized understanding.

## 2017-01-02 NOTE — Care Management Note (Signed)
Case Management Note  Patient Details  Name: Daniel Estes MRN: [id number] Date of Birth: [birth year]  Subjective/Objective:      Referral called and faxed to [place] at [facility] of A/C per Dr Benjie Karvonen wants Mr Brubacher to have 02 available for comfort care. Requested that Hospice have their DME agency deliver a portable 02 tank to Mr [facility] hospital room, and to set up home 02 at his residence. A referral for Hospice services at home was called and faxed to [facility] at [facility] of A/C.                 Action/Plan:   Expected Discharge Date:  01/02/17               Expected Discharge Plan:     In-House Referral:     Discharge planning Services     Post Acute Care Choice:    Choice offered to:     DME Arranged:    DME Agency:     HH Arranged:    HH Agency:     Status of Service:     If discussed at [employer] [facility] [employer], dates discussed:    Additional Comments:  Lam Bjorklund A, RN 01/02/2017, 10:42 AM

## 2017-01-03 ENCOUNTER — Emergency Department
Admission: EM | Admit: 2017-01-03 | Discharge: 2017-01-03 | Disposition: A | Discharge status: Home / Self Care | Payer: Medicare Other | Attending: Emergency Medicine | Admitting: Emergency Medicine

## 2017-01-03 ENCOUNTER — Emergency Department: Payer: Medicare Other

## 2017-01-03 ENCOUNTER — Other Ambulatory Visit: Payer: Self-pay

## 2017-01-03 DIAGNOSIS — Z8546 Personal history of malignant neoplasm of prostate: Secondary | ICD-10-CM | POA: Insufficient documentation

## 2017-01-03 DIAGNOSIS — K5900 Constipation, unspecified: Secondary | ICD-10-CM | POA: Insufficient documentation

## 2017-01-03 DIAGNOSIS — Z7901 Long term (current) use of anticoagulants: Secondary | ICD-10-CM | POA: Insufficient documentation

## 2017-01-03 DIAGNOSIS — Z79899 Other long term (current) drug therapy: Secondary | ICD-10-CM | POA: Insufficient documentation

## 2017-01-03 DIAGNOSIS — R110 Nausea: Secondary | ICD-10-CM | POA: Diagnosis not present

## 2017-01-03 DIAGNOSIS — R1084 Generalized abdominal pain: Secondary | ICD-10-CM | POA: Diagnosis not present

## 2017-01-03 DIAGNOSIS — I10 Essential (primary) hypertension: Secondary | ICD-10-CM | POA: Insufficient documentation

## 2017-01-03 DIAGNOSIS — Z86711 Personal history of pulmonary embolism: Secondary | ICD-10-CM | POA: Diagnosis not present

## 2017-01-03 LAB — COMPREHENSIVE METABOLIC PANEL
ALBUMIN: 3.3 g/dL — AB (ref 3.5–5.0)
ALK PHOS: 44 U/L (ref 38–126)
ALT: 16 U/L — AB (ref 17–63)
ANION GAP: 11 (ref 5–15)
AST: 52 U/L — ABNORMAL HIGH (ref 15–41)
BUN: 12 mg/dL (ref 6–20)
CALCIUM: 8.6 mg/dL — AB (ref 8.9–10.3)
CHLORIDE: 99 mmol/L — AB (ref 101–111)
CO2: 21 mmol/L — AB (ref 22–32)
CREATININE: 1.08 mg/dL (ref 0.61–1.24)
GFR calc non Af Amer: >60 mL/min (ref >60)
GLUCOSE: 130 mg/dL — AB (ref 65–99)
Potassium: 3.3 mmol/L — ABNORMAL LOW (ref 3.5–5.1)
SODIUM: 131 mmol/L — AB (ref 135–145)
Total Bilirubin: 0.8 mg/dL (ref 0.3–1.2)
Total Protein: 6.9 g/dL (ref 6.5–8.1)

## 2017-01-03 LAB — LIPASE, BLOOD: Lipase: 22 U/L (ref 11–51)

## 2017-01-03 LAB — CBC
HCT: 35.2 % — ABNORMAL LOW (ref 40.0–52.0)
Hemoglobin: 11.7 g/dL — ABNORMAL LOW (ref 13.0–18.0)
MCH: 29.6 pg (ref 26.0–34.0)
MCHC: 33.2 g/dL (ref 32.0–36.0)
MCV: 89.1 fL (ref 80.0–100.0)
PLATELETS: 426 10*3/uL (ref 150–440)
RBC: 3.95 MIL/uL — ABNORMAL LOW (ref 4.40–5.90)
RDW: 15.9 % — AB (ref 11.5–14.5)
WBC: 7.3 10*3/uL (ref 3.8–10.6)

## 2017-01-03 MED ORDER — IOPAMIDOL (ISOVUE-300) INJECTION 61%
30.0000 mL | Freq: Once | INTRAVENOUS | Status: AC | PRN
  Administered 2017-01-03: 30 mL via ORAL

## 2017-01-03 MED ORDER — HYDROMORPHONE HCL 1 MG/ML IJ SOLN
1.0000 mg | Freq: Once | INTRAMUSCULAR | Status: AC
  Administered 2017-01-03: 1 mg via INTRAVENOUS
  Filled 2017-01-03: qty 1

## 2017-01-03 MED ORDER — SODIUM CHLORIDE 0.9 % IV BOLUS (SEPSIS)
1000.0000 mL | Freq: Once | INTRAVENOUS | Status: AC
  Administered 2017-01-03: 1000 mL via INTRAVENOUS

## 2017-01-03 MED ORDER — DOCUSATE SODIUM 100 MG PO CAPS: 100.0000 mg | ORAL_CAPSULE | Freq: Two times a day (BID) | ORAL | 2 refills | Status: AC

## 2017-01-03 MED ORDER — ONDANSETRON HCL 4 MG/2ML IJ SOLN
4.0000 mg | Freq: Once | INTRAMUSCULAR | Status: AC
  Administered 2017-01-03: 4 mg via INTRAVENOUS
  Filled 2017-01-03: qty 2

## 2017-01-03 MED ORDER — IOPAMIDOL (ISOVUE-300) INJECTION 61%
100.0000 mL | Freq: Once | INTRAVENOUS | Status: AC | PRN
  Administered 2017-01-03: 100 mL via INTRAVENOUS

## 2017-01-03 MED ORDER — MORPHINE SULFATE (PF) 4 MG/ML IV SOLN
4.0000 mg | Freq: Once | INTRAVENOUS | Status: AC
  Administered 2017-01-03: 4 mg via INTRAVENOUS
  Filled 2017-01-03: qty 1

## 2017-01-03 MED ORDER — OXYCODONE-ACETAMINOPHEN 5-325 MG PO TABS: 1.0000 | ORAL_TABLET | Freq: Four times a day (QID) | ORAL | 0 refills | Status: AC | PRN

## 2017-01-03 NOTE — Discharge Instructions (Signed)
As we discussed please return to the emergency department for any significant worsening of abdominal pain or if you develop any chest pain or increased trouble breathing.  Please follow-up with your doctor for recheck/reevaluation.

## 2017-01-03 NOTE — ED Notes (Signed)
Patient does not appear to be in any acute distress at time of discharge. Patient wheeled to lobby in wheelchair. Patient denies any comments or concerns regarding discharge.  

## 2017-01-03 NOTE — ED Provider Notes (Signed)
[facility] Emergency Department Provider Note  Time seen: 3:26 PM  I have reviewed the triage vital signs and the nursing notes.   HISTORY  Chief Complaint Abdominal Pain (secondary to prostate cancer)    HPI Daniel Estes is a 76 y.o. 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  According to the patient for the past 2 days he has been experiencing worsening abdominal pain and some abdominal distention.  Patient states nausea with dry heaving but no vomiting.  Patient has a history of metastatic end-stage prostate cancer with worsening abdominal pain for the past 2 days, constipation for the past 4 days.  Patient is only prescribed tramadol for pain at home.  Past Medical History:  Diagnosis Date  . Hyperlipidemia   . Hypertension   . Prostate cancer [facility])    metastasized    Patient Active Problem List   Diagnosis Date Noted  . Pulmonary embolism ([place]) 12/31/2016  . GI bleed 04/16/2016  . Lymphedema 01/06/2016  . Class 1 obesity 12/31/2014  . 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 Primary malignant neoplasm of prostate ([place]) 10/10/2014  . Bladder calculi 09/12/2014  . Patient refusal of treatment 11/15/2012  . Encounter for screening for malignant neoplasm of intestinal tract 08/04/2012  . Abdominal aortic aneurysm (AAA) without rupture ([place]) 04/13/2012  . 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 HLD (hyperlipidemia) 04/13/2012  . Hematoma 04/13/2012  . Bladder retention 04/13/2012  . Injury of shoulder 01/18/2012  . Limb swelling 01/18/2012  . Absence of bladder continence 12/30/2011  . Persons encountering health services in other specified circumstances 12/30/2011  . BP (high blood pressure) 12/30/2011    Past Surgical History:  Procedure Laterality Date  . APPENDECTOMY       Prior to Admission medications   Medication Sig Start Date End Date Taking? Authorizing Provider  acetaminophen (TYLENOL) 500 MG tablet Take 500-1,000 mg by mouth every 6 (six) hours as needed.     [provider]  apixaban (ELIQUIS) 5 MG TABS tablet Take 2 tablets (10 mg total) by mouth 2 (two) times daily for 6 days. 01/02/17 01/08/17  Bettey Costa, MD  apixaban (ELIQUIS) 5 MG TABS tablet Take 1 tablet (5 mg total) by mouth 2 (two) times daily. 01/09/17   Bettey Costa, MD  pantoprazole (PROTONIX) 40 MG tablet Take 1 tablet (40 mg total) by mouth daily. 01/02/17   Bettey Costa, MD  predniSONE (DELTASONE) 10 MG tablet Take 10 mg by mouth daily with breakfast. Hold on days taking dexamethasone. 01/16/15   [provider]  prochlorperazine (COMPAZINE) 10 MG tablet Take 10 mg by mouth every 6 (six) hours as needed for nausea/vomiting. 04/01/16   [provider]  traMADol (ULTRAM) 50 MG tablet Take 1 tablet (50 mg total) by mouth every 12 (twelve) hours as needed for severe pain. 04/17/16   Hillary Bow, MD    Allergies  Allergen Reactions  . No Known Allergies     Family History  Problem Relation Age of Onset  . Hypertension Mother   . Hypertension Father   . Hyperlipidemia Father     Social History Social History   Tobacco Use  . Smoking status: Never Smoker  . Smokeless tobacco: Never Used  Substance Use Topics  . Alcohol use: No    Alcohol/week: 0.0 oz    Frequency: Never  Comment: occ  . Drug use: No    Review of Systems Constitutional: Negative for fever Cardiovascular: Negative for chest pain. Respiratory: Negative for shortness of breath. Gastrointestinal: Positive for abdominal pain and constipation times 4 days.  Dry heaving. Genitourinary: Negative for dysuria. Neurological: Negative for headache All other ROS negative  ____________________________________________   PHYSICAL EXAM:  VITAL SIGNS:  Constitutional: Alert and  oriented. Well appearing and in no distress. Eyes: Normal exam ENT   Head: Normocephalic and atraumatic   Mouth/Throat: Mucous membranes are moist. Cardiovascular: Normal rate, regular rhythm. Respiratory: Normal respiratory effort without tachypnea nor retractions. Breath sounds are clear  Gastrointestinal: Soft, moderate fairly diffuse abdominal tenderness to palpation with tympanic percussion. Musculoskeletal: Nontender with normal range of motion in all extremities.  Neurologic:  Normal speech and language. No gross focal neurologic deficits  Skin:  Skin is warm, dry and intact.  Psychiatric: Mood and affect are normal.   ____________________________________________   RADIOLOGY  CT scan is essentially unchanged no acute findings.  ____________________________________________   INITIAL IMPRESSION / ASSESSMENT AND PLAN / ED COURSE  Pertinent labs & imaging results that were available during my care of the patient were reviewed by me and considered in my medical decision making (see chart for details).  Patient presents to the emergency department for abdominal pain, some distention and 4 days of constipation.  Differential would include constipation, bowel obstruction, colitis, diverticulitis, ileus, appendicitis, worsening metastatic spread of disease.  We will check labs, treat pain and nausea, obtain a CT of the abdomen/pelvis to further evaluate.  I reviewed the patient's records including recent discharge summary for saddle PE as well as recent CT abdomen/pelvis 3 days ago however the patient has new pain with worsening distention tympanic to percussion will obtain a repeat CT imaging of the abdomen.  CT scan is largely unchanged, no acute findings.  Patient states his pain is much improved.  He does remain tachycardic currently 120-125 bpm during my reevaluation.  I highly suspect this is likely due to his recent pulmonary embolism.  Given his remaining tachycardia and the  presentation of abdominal pain today offered to admit the patient to the hospital for further pain management and workup.  Patient was seen by hospice today to establish hospice care.  States he does not want to be admitted to the hospital and wishes to go home.  I believe this is a reasonable plan given the patient's prognosis and wish to spend his time at home.  We will place the patient on pain medication and he will follow-up with hospice.  Patient and family agreeable.  ____________________________________________   FINAL CLINICAL IMPRESSION(S) / ED DIAGNOSES  Abdominal pain    Harvest Dark, MD 01/03/17 [phone]

## 2017-01-03 NOTE — ED Triage Notes (Signed)
EMS reports abdominal pain secondary to prostate cancer. Reports 100 mcg Fentanyl given IV.

## 2017-02-02 DEATH — deceased

## 2017-02-08 ENCOUNTER — Telehealth: Payer: Self-pay | Admitting: Internal Medicine

## 2017-02-08 NOTE — Telephone Encounter (Signed)
FYI, Pt was scheduled to come in tomorrow. I called to confirm appt, wife states that pt is deceased. Thank you!

## 2017-02-08 NOTE — Telephone Encounter (Signed)
Yes I knew wife told me at her last visit  Thanks [place]

## 2017-02-09 ENCOUNTER — Ambulatory Visit: Payer: PRIVATE HEALTH INSURANCE | Admitting: Internal Medicine

## 2017-02-10 NOTE — Telephone Encounter (Signed)
Ok. Thank you.

## 2018-08-07 IMAGING — CT CT ABD-PELV W/ CM
2 of 5 series · 15 of 46 positions shown, 17 images · IV contrast (APPLIED)
Comparison: 12/31/2016

CLINICAL DATA: Increased abdominal and pelvic pain over the last 4
days. Patient reports constipation. History of prostate carcinoma.

EXAM:
CT ABDOMEN AND PELVIS WITH CONTRAST
TECHNIQUE: Multidetector CT imaging of the abdomen and pelvis was performed
using the standard protocol following bolus administration of
intravenous contrast.
CONTRAST:  30mL UBI3Z8-ROO IOPAMIDOL (UBI3Z8-ROO) INJECTION 61%,
100mL [id number] IOPAMIDOL ([id number]-ROO) INJECTION 61%

[Series 2: routine abd/pel with · axial · 0.84mm/px · z∈[-520,-30]mm · 12 of 110 slices shown, 14 images]
[im 6/110  soft-tissue]
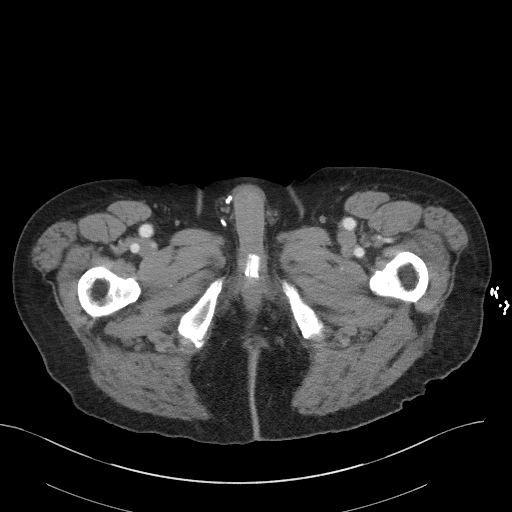
[im 6/110  bone]
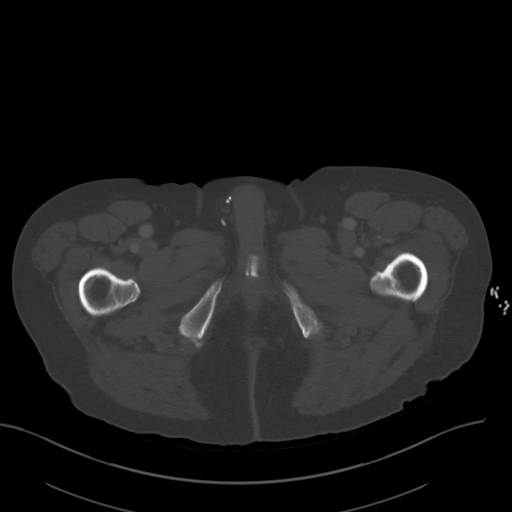
[im 17/110  soft-tissue]
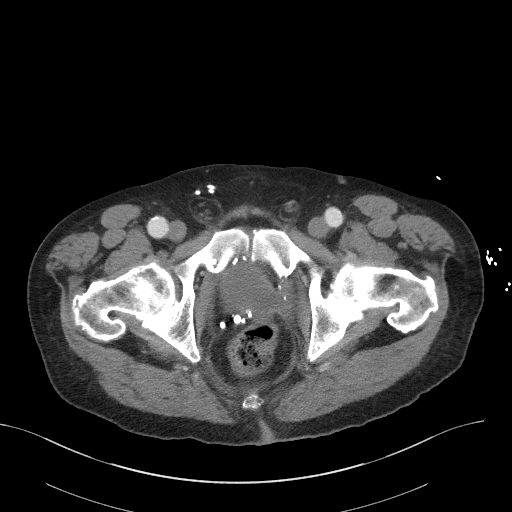
[im 22/110  soft-tissue]
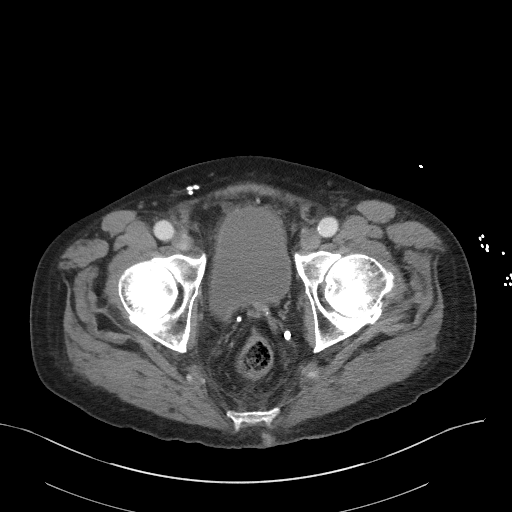
[im 33/110  soft-tissue]
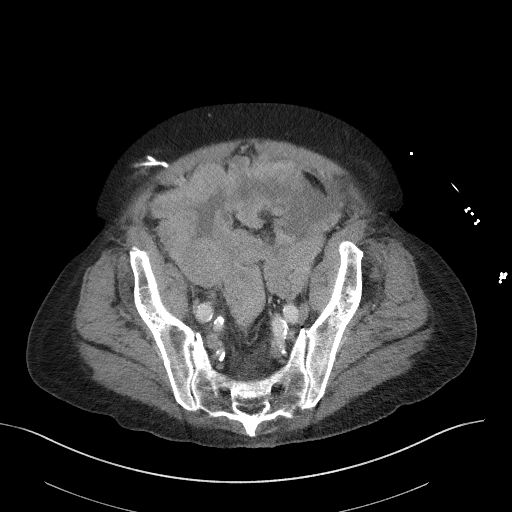
[im 44/110  soft-tissue]
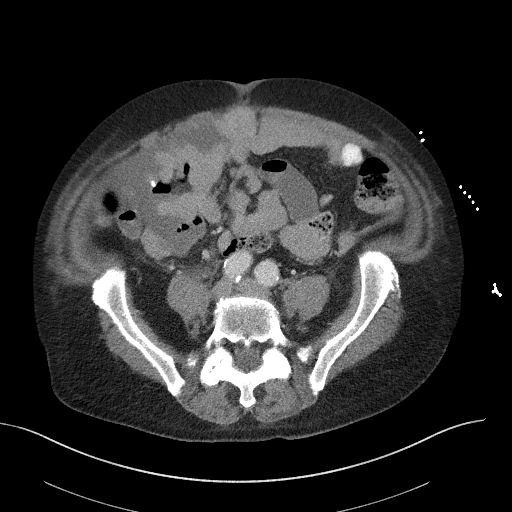
[im 50/110  soft-tissue]
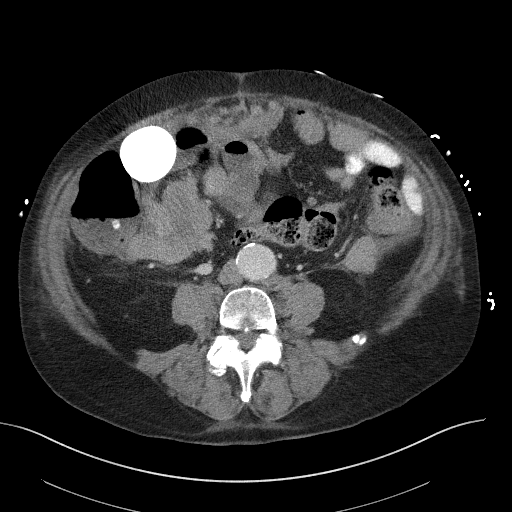
[im 60/110  soft-tissue]
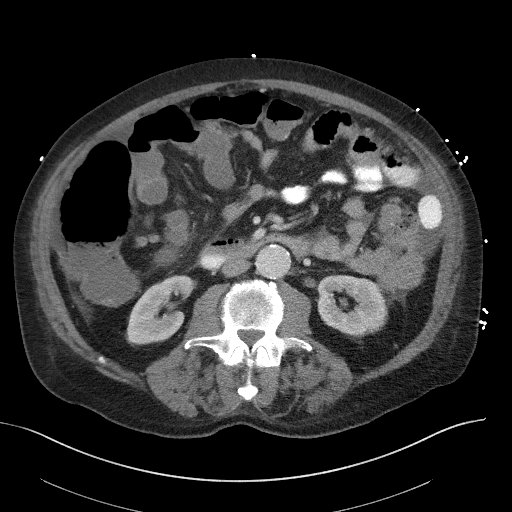
[im 66/110  soft-tissue]
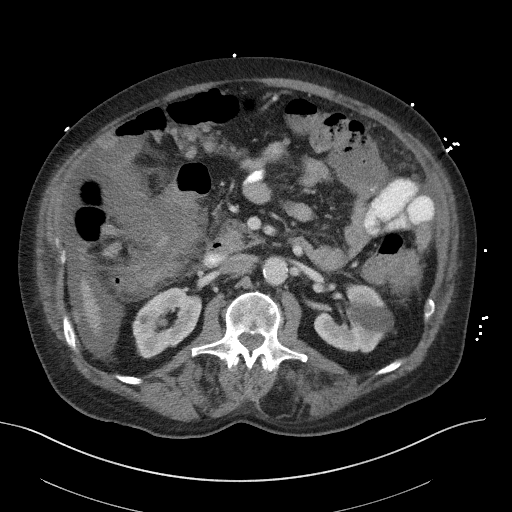
[im 77/110  soft-tissue]
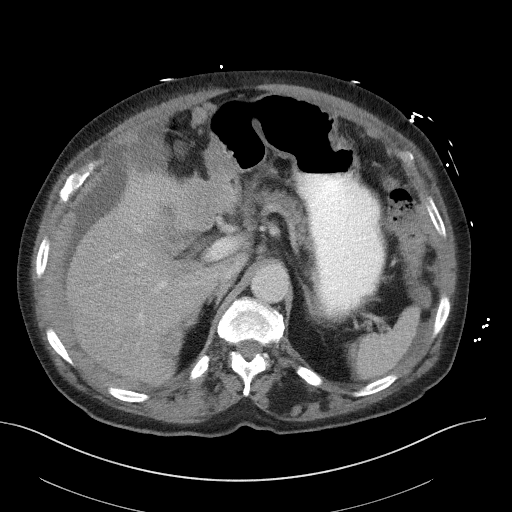
[im 77/110  bone]
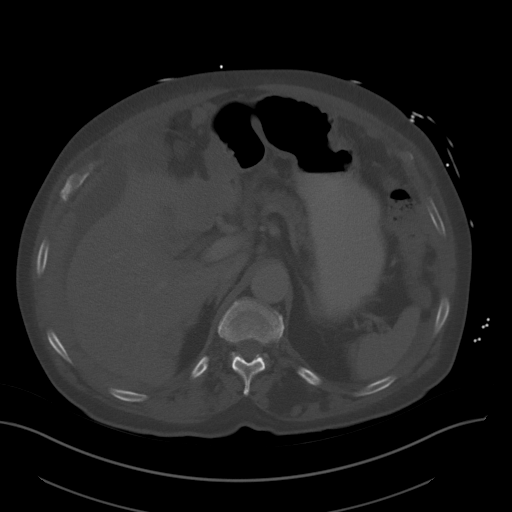
[im 88/110  soft-tissue]
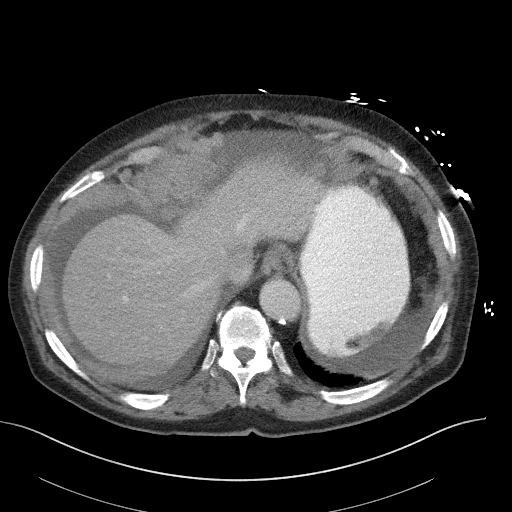
[im 93/110  soft-tissue]
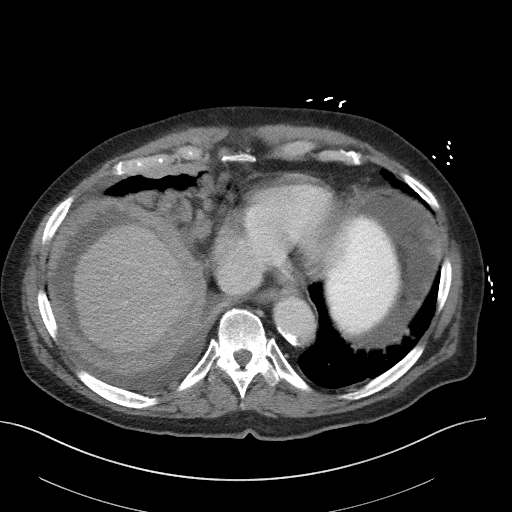
[im 104/110  soft-tissue]
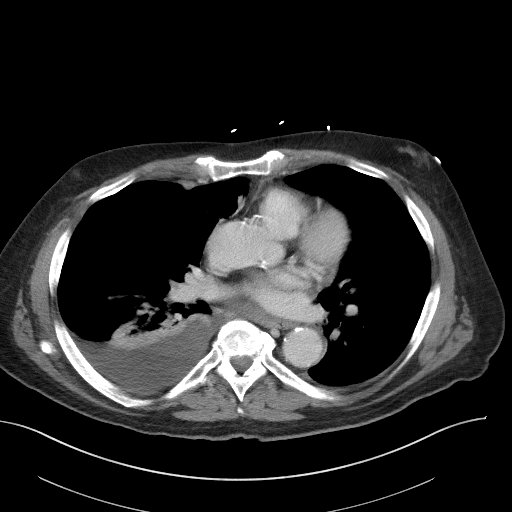

[Series 5: coronal st · coronal · 0.79mm/px · 3 of 106 slices shown]
[im 36/106  soft-tissue]
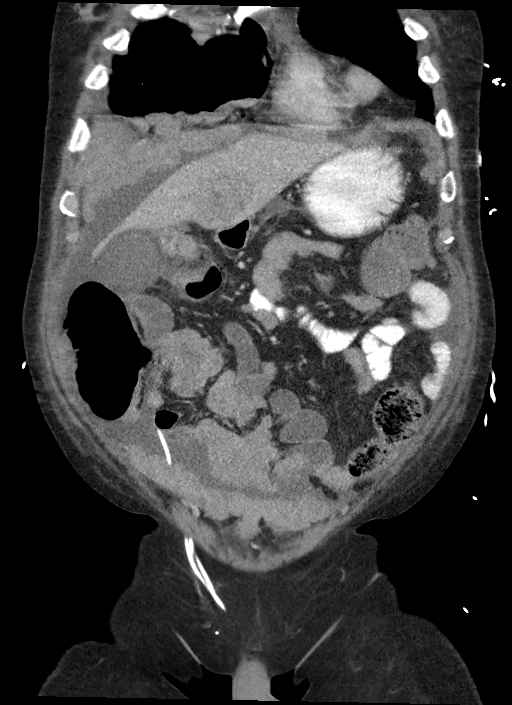
[im 47/106  soft-tissue]
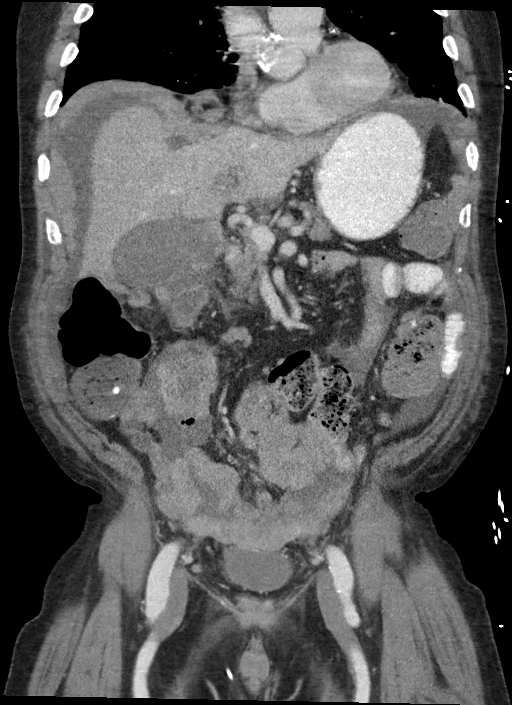
[im 59/106  soft-tissue]
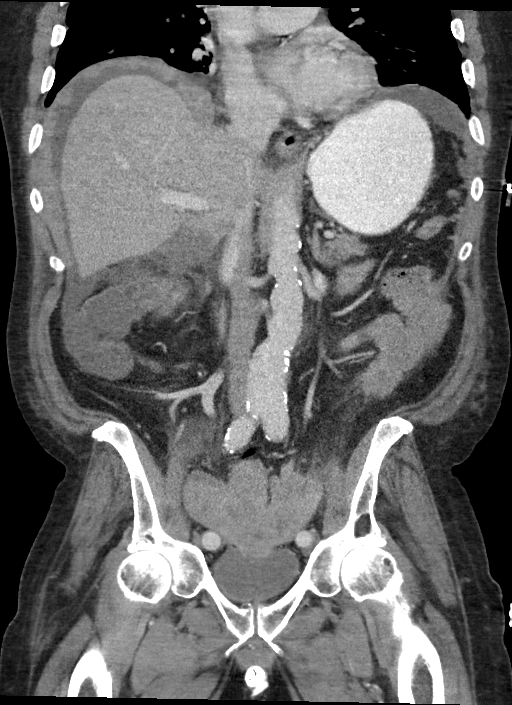

[15 of 46 positions shown; findings below may reference images not displayed]

FINDINGS: Lower chest: Moderate right pleural effusion. Dependent right lower
lobe opacity, likely atelectasis. Mild subsegmental atelectasis at
the left lung base. There is pleural based nodularity on the right
consistent with metastatic disease stable from the prior CT.

Hepatobiliary: 1 cm low-density lesion in segment 6. Subcentimeter
low-density lesion in segment 4A. These are stable in both likely
cysts. No other convincing liver lesions. Liver normal in size. The
gallbladder is distended. Its wall is poorly defined due to adjacent
fluid and peritoneal tumor. No bile duct dilation.

Pancreas: Unremarkable. No pancreatic ductal dilatation or
surrounding inflammatory changes.

Spleen: Normal in size without focal abnormality.

Adrenals/Urinary Tract: Small stable left adrenal nodule, stable
from the CT dated 04/12/2012, consistent with an adenoma. No right
adrenal mass. 4.1 cm left midpole renal cyst. Smaller low-density
masses are noted in the right kidney also consistent with cysts. No
hydronephrosis. Symmetric renal enhancement and excretion. Ureters
normal course and in caliber. Bladder is unremarkable.

Stomach/Bowel: No evidence of bowel obstruction. No stomach or small
bowel wall thickening. There is extensive peritoneal carcinomatosis
with serosal disease as well common involving small bowel and
portions of the colon.

Vascular/Lymphatic: Aortic atherosclerosis. Aorta is ectatic.
Infrarenal portion is dilated to a maximum of 3 cm. Mild peri celiac
and gastrohepatic ligament chain adenopathy, stable from the prior
chest CTA.

Reproductive: Prostate gland has been removed. Prostatic urethra
sphincter noted with a reservoir in the right mid abdomen.

Other: Extensive peritoneal carcinomatosis with large areas of
tumors involving the greater omentum, peritoneal and serosal
surfaces and mesenteric, associated with a small amount of ascites.
Extensive tumor surrounds the liver and abuts the gallbladder. Tumor
involves abdominal wall adjacent to the umbilicus.

Musculoskeletal: No fracture or acute finding. No osteoblastic or
osteolytic lesions.
IMPRESSION: 1. No acute abnormalities within the abdomen or pelvis.
2. Extensive peritoneal carcinomatosis with bulky tumor involving
the peritoneal surfaces, greater omentum, mesentery and serosal
surfaces. There is a small amount of associated ascites. Pleural
metastatic disease is also noted at the right lung base.
